# Patient Record
Sex: Male | Born: 1995 | Race: Black or African American | Hispanic: No | Marital: Single | Smoking: Current every day smoker
Health system: Southern US, Community
[De-identification: ages and names within clinical notes are randomized; demographics above are authoritative.]

## PROBLEM LIST (undated history)

## (undated) ENCOUNTER — Emergency Department (HOSPITAL_COMMUNITY): Admission: EM | Payer: Self-pay | Source: Home / Self Care

## (undated) DIAGNOSIS — R569 Unspecified convulsions: Secondary | ICD-10-CM

## (undated) HISTORY — PX: CIRCUMCISION: SHX1350

---
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
## 2018-07-15 ENCOUNTER — Encounter (HOSPITAL_COMMUNITY): Payer: Self-pay

## 2018-07-15 ENCOUNTER — Other Ambulatory Visit: Payer: Self-pay

## 2018-07-15 ENCOUNTER — Ambulatory Visit (HOSPITAL_COMMUNITY)
Admission: EM | Admit: 2018-07-15 | Discharge: 2018-07-15 | Disposition: A | Discharge status: Home / Self Care | Payer: 59 | Attending: Family Medicine | Admitting: Family Medicine

## 2018-07-15 ENCOUNTER — Ambulatory Visit (INDEPENDENT_AMBULATORY_CARE_PROVIDER_SITE_OTHER): Payer: 59

## 2018-07-15 DIAGNOSIS — S0181XA Laceration without foreign body of other part of head, initial encounter: Secondary | ICD-10-CM

## 2018-07-15 DIAGNOSIS — M25511 Pain in right shoulder: Secondary | ICD-10-CM | POA: Diagnosis not present

## 2018-07-15 DIAGNOSIS — S43101A Unspecified dislocation of right acromioclavicular joint, initial encounter: Secondary | ICD-10-CM

## 2018-07-15 DIAGNOSIS — S0990XA Unspecified injury of head, initial encounter: Secondary | ICD-10-CM | POA: Diagnosis not present

## 2018-07-15 DIAGNOSIS — S4991XA Unspecified injury of right shoulder and upper arm, initial encounter: Secondary | ICD-10-CM | POA: Diagnosis not present

## 2018-07-15 DIAGNOSIS — W19XXXA Unspecified fall, initial encounter: Secondary | ICD-10-CM | POA: Diagnosis not present

## 2018-07-15 NOTE — Discharge Instructions (Addendum)
You may use over the counter ibuprofen or acetaminophen as needed for pain.  Please seek prompt medical care if: You have: A very bad (severe) headache that is not helped by medicine. Trouble walking or weakness in your arms and legs. Clear or bloody fluid coming from your nose or ears. Changes in your seeing (vision). You throw up (vomit). Your symptoms get worse. You lose balance. Your speech is slurred. You pass out. You are much sleepier and have trouble staying awake. The black centers of your eyes (pupils) change in size.  These symptoms may be an emergency. Do not wait to see if the symptoms will go away. Get medical help right away. Call your local emergency services. Do not drive yourself to the hospital.

## 2018-07-15 NOTE — ED Triage Notes (Signed)
Patient presents to Urgent Care with complaints of having a seizure and falling straight onto his head since this afternoon. Patient states he thinks his seizure lasted less than 5 minutes but does not know for sure. Oral trauma noted, bleeding to forehead but controlled (patient covered in blood), pain to right shoulder as well, not able to lift arm. Pt states he used to be on seizure medication several years ago but he is not on it anymore. Had seizure last month as well.

## 2018-07-16 NOTE — ED Provider Notes (Signed)
[facility]   [id number] 07/15/18 Arrival Time: 1538  ASSESSMENT & PLAN:  1. Minor head injury, initial encounter   2. Pain in joint of right shoulder   3. Separation of right acromioclavicular joint, initial encounter   4. Forehead laceration, initial encounter    Discussed post-concussive symptoms and recommended follow up if he experiences. Normal neurologic exam. No indication for neurodiagnostic imaging at this time.  I have personally viewed the imaging studies ordered this visit. Question mild AC joint separation.  Orders Placed This Encounter  Procedures  . Apply dressing  . Apply sling arm foam   OTC Tylenol as needed.  Procedure: Verbal consent obtained. Patient provided with risks and alternatives to the procedure. Wound copiously irrigated with NS then cleansed with betadine. Anesthetized with 3 mL of lidocaine without epinephrine. Wound carefully explored. No foreign bodies or nonviable tissue noted. Using sterile technique 5 interrupted 6-0 Prolene sutures were placed to reapproximate his forehead wound. Patient tolerated procedure well. No complications. Minimal bleeding. Patient advised to look for and return for any signs of infection such as redness, swelling, discharge, or worsening pain. Return for suture removal in 5-7 days.   Discharge Instructions     You may use over the counter ibuprofen or acetaminophen as needed for pain.  Please seek prompt medical care if:  You have: ? A very bad (severe) headache that is not helped by medicine. ? Trouble walking or weakness in your arms and legs. ? Clear or bloody fluid coming from your nose or ears. ? Changes in your seeing (vision).  You throw up (vomit).  Your symptoms get worse.  You lose balance.  Your speech is slurred.  You pass out.  You are much sleepier and have trouble staying awake.  The black centers of your eyes (pupils) change in size.  These symptoms may be an emergency.  Do not wait to see if the symptoms will go away. Get medical help right away. Call your local emergency services. Do not drive yourself to the hospital.     See AVS for head injury discharge precautions. ED if needed.  Reviewed expectations re: course of current medical issues. Questions answered. Outlined signs and symptoms indicating need for more acute intervention. Patient verbalized understanding. After Visit Summary given.  SUBJECTIVE: History from: patient. Austin Montoya is a 23 y.o. male who presents with complaint of a head injury earlier today. Seizure disorder with a few breakthrough seizures per month. Reports taking his medications as directed. Does not remember seizure but his girlfriend was with him and witnessed. Reports being told he fell on a tile floor at his residence hitting his forehead with resulting forehead laceration. Minimal post-ictal symptoms. No loss of bowel or bladder control. EMS called to evaluate; forehead wound bandaged. He reports no headache or visual changes. Ambulatory since injury. No extremity sensation changes or weakness. No associated n/v. No visual changes. No recent illnesses or new medications. Reports no h/o recurrent concussions.  Also reports R shoulder pain since fall. "Usually fall on my right side when I have a seizure." Soreness with RUE motion. No swelling. No extremity sensation changes or weakness.  ROS: As per HPI. All other systems negative.  OBJECTIVE:  Vitals:   07/15/18 1559  BP: 122/78  Pulse: (!) 55  Resp: 17  Temp: 98.7 F (37.1 C)  TempSrc: Oral  SpO2: 100%    Glascow Coma Scale: 15  General appearance: alert; no distress HEENT: normocephalic; conjunctivae normal; TMs  normal; oral mucosa normal Neck: supple with FROM; no midline cervical tenderness or deformity; no anterior mass or crepitus; trachea midline Lungs: unlabored respirations Abdomen: soft, non-tender; no bruising Back: no midline tenderness  Extremities: moves all extremities normally but reports pain with R shoulder movement and poorly localized tenderness to palpation; no gross deformities; no edema; symmetrical with no gross deformities Skin: warm and dry; mid forehead with irregular stellate laceration approx 1.5 cm total; moderate bleeding; minimal swelling; minimal tenderness to palpation Neurologic: normal gait Psychological: alert and cooperative; normal mood and affect  Imaging: Dg Shoulder Right  Result Date: 07/15/2018 CLINICAL DATA:  Right shoulder pain after fall. EXAM: RIGHT SHOULDER - 2+ VIEW COMPARISON:  None. FINDINGS: Irregularity and slight flattening of the posterosuperior humeral head and inferior glenoid. Slight elevation of the distal clavicle. Possible coracoid fracture, only seen on one view. No dislocation. Joint spaces are preserved. Bone mineralization is normal. Soft tissues are unremarkable. IMPRESSION: 1. Sequelae of recent shoulder dislocation with humeral head Hill-Sachs deformity and suspected bony Bankart injury. 2. Grade 2 acromioclavicular separation. 3. Possible coracoid fracture, only seen on one view. Electronically Signed   By: Obie Dredge M.D.   On: 07/15/2018 16:53   No Known Allergies   Past Medical History:  Diagnosis Date  . Seizures (HCC)    Past Surgical History:  Procedure Laterality Date  . CIRCUMCISION     Family History  Problem Relation Age of Onset  . Migraines Mother   . ADD / ADHD Father   . Depression Father   . Anxiety disorder Father   . Migraines Brother   . ADD / ADHD Brother   . Leukemia Maternal Grandfather   . Heart Problems Maternal Grandfather   . Migraines Maternal Grandfather   . Migraines Maternal Aunt   . Migraines Cousin        Maternal 1st Cousin   Social History   Socioeconomic History  . Marital status: Single    Spouse name: Not on file  . Number of children: Not on file  . Years of education: Not on file  . Highest education level:  Not on file  Occupational History  . Not on file  Social Needs  . Financial resource strain: Not on file  . Food insecurity:    Worry: Not on file    Inability: Not on file  . Transportation needs:    Medical: Not on file    Non-medical: Not on file  Tobacco Use  . Smoking status: Current Every Day Smoker    Packs/day: 1.00    Types: Cigarettes  . Smokeless tobacco: Never Used  Substance and Sexual Activity  . Alcohol use: No    Comment: Drinks alcohol twice a month  . Drug use: No    Frequency: 7.0 times per week    Types: Marijuana  . Sexual activity: Yes  Lifestyle  . Physical activity:    Days per week: Not on file    Minutes per session: Not on file  . Stress: Not on file  Relationships  . Social connections:    Talks on phone: Not on file    Gets together: Not on file    Attends religious service: Not on file    Active member of club or organization: Not on file    Attends meetings of clubs or organizations: Not on file    Relationship status: Not on file  Other Topics Concern  . Not on file  Social History Narrative  Alben graduated from high school. He is not currently employed.   He enjoys playing basketball.    Lives with his parents.        Mardella Layman, MD 07/16/18 [phone]

## 2019-10-03 ENCOUNTER — Emergency Department (HOSPITAL_COMMUNITY): Payer: No Typology Code available for payment source

## 2019-10-03 ENCOUNTER — Encounter (HOSPITAL_COMMUNITY): Payer: Self-pay | Admitting: Emergency Medicine

## 2019-10-03 ENCOUNTER — Ambulatory Visit (HOSPITAL_COMMUNITY)
Admission: EM | Admit: 2019-10-03 | Discharge: 2019-10-03 | Disposition: A | Discharge status: Home / Self Care | Payer: No Typology Code available for payment source | Attending: Nurse Practitioner | Admitting: Nurse Practitioner

## 2019-10-03 ENCOUNTER — Other Ambulatory Visit: Payer: Self-pay

## 2019-10-03 ENCOUNTER — Emergency Department (HOSPITAL_COMMUNITY)
Admission: EM | Admit: 2019-10-03 | Discharge: 2019-10-04 | Disposition: A | Discharge status: Home / Self Care | Payer: No Typology Code available for payment source | Attending: Emergency Medicine | Admitting: Emergency Medicine

## 2019-10-03 DIAGNOSIS — Z79899 Other long term (current) drug therapy: Secondary | ICD-10-CM | POA: Insufficient documentation

## 2019-10-03 DIAGNOSIS — F322 Major depressive disorder, single episode, severe without psychotic features: Secondary | ICD-10-CM | POA: Diagnosis not present

## 2019-10-03 DIAGNOSIS — G40909 Epilepsy, unspecified, not intractable, without status epilepticus: Secondary | ICD-10-CM | POA: Insufficient documentation

## 2019-10-03 DIAGNOSIS — F17290 Nicotine dependence, other tobacco product, uncomplicated: Secondary | ICD-10-CM | POA: Insufficient documentation

## 2019-10-03 DIAGNOSIS — R45851 Suicidal ideations: Secondary | ICD-10-CM | POA: Insufficient documentation

## 2019-10-03 DIAGNOSIS — F419 Anxiety disorder, unspecified: Secondary | ICD-10-CM | POA: Insufficient documentation

## 2019-10-03 DIAGNOSIS — F329 Major depressive disorder, single episode, unspecified: Secondary | ICD-10-CM | POA: Insufficient documentation

## 2019-10-03 DIAGNOSIS — F17210 Nicotine dependence, cigarettes, uncomplicated: Secondary | ICD-10-CM | POA: Insufficient documentation

## 2019-10-03 DIAGNOSIS — M25511 Pain in right shoulder: Secondary | ICD-10-CM | POA: Insufficient documentation

## 2019-10-03 DIAGNOSIS — R45850 Homicidal ideations: Secondary | ICD-10-CM | POA: Insufficient documentation

## 2019-10-03 DIAGNOSIS — R569 Unspecified convulsions: Secondary | ICD-10-CM

## 2019-10-03 LAB — CBC
HCT: 41.3 % (ref 39.0–52.0)
Hemoglobin: 13.9 g/dL (ref 13.0–17.0)
MCH: 29.1 pg (ref 26.0–34.0)
MCHC: 33.7 g/dL (ref 30.0–36.0)
MCV: 86.6 fL (ref 80.0–100.0)
Platelets: 198 10*3/uL (ref 150–400)
RBC: 4.77 MIL/uL (ref 4.22–5.81)
RDW: 12.1 % (ref 11.5–15.5)
WBC: 6.8 10*3/uL (ref 4.0–10.5)
nRBC: 0 % (ref 0.0–0.2)

## 2019-10-03 LAB — COMPREHENSIVE METABOLIC PANEL
ALT: 21 U/L (ref 0–44)
AST: 36 U/L (ref 15–41)
Albumin: 4.5 g/dL (ref 3.5–5.0)
Alkaline Phosphatase: 61 U/L (ref 38–126)
Anion gap: 8 (ref 5–15)
BUN: 12 mg/dL (ref 6–20)
CO2: 24 mmol/L (ref 22–32)
Calcium: 9.3 mg/dL (ref 8.9–10.3)
Chloride: 108 mmol/L (ref 98–111)
Creatinine, Ser: 0.75 mg/dL (ref 0.61–1.24)
GFR calc Af Amer: >60 mL/min (ref >60)
GFR calc non Af Amer: >60 mL/min (ref >60)
Glucose, Bld: 87 mg/dL (ref 70–99)
Potassium: 3.6 mmol/L (ref 3.5–5.1)
Sodium: 140 mmol/L (ref 135–145)
Total Bilirubin: 0.1 mg/dL — ABNORMAL LOW (ref 0.3–1.2)
Total Protein: 7.3 g/dL (ref 6.5–8.1)

## 2019-10-03 LAB — SALICYLATE LEVEL: Salicylate Lvl: <7 mg/dL — ABNORMAL LOW (ref 7.0–30.0)

## 2019-10-03 LAB — ACETAMINOPHEN LEVEL: Acetaminophen (Tylenol), Serum: <10 ug/mL — ABNORMAL LOW (ref 10–30)

## 2019-10-03 LAB — ETHANOL: Alcohol, Ethyl (B): <10 mg/dL (ref <10)

## 2019-10-03 LAB — VALPROIC ACID LEVEL: Valproic Acid Lvl: <10 ug/mL — ABNORMAL LOW (ref 50.0–100.0)

## 2019-10-03 MED ORDER — TOPIRAMATE 100 MG PO TABS
200.0000 mg | ORAL_TABLET | Freq: Two times a day (BID) | ORAL | Status: DC
  Administered 2019-10-03 – 2019-10-04 (×2): 200 mg via ORAL
  Filled 2019-10-03 (×2): qty 2
  Filled 2019-10-03: qty 8
  Filled 2019-10-03: qty 2

## 2019-10-03 MED ORDER — LEVETIRACETAM IN NACL 1000 MG/100ML IV SOLN
1000.0000 mg | Freq: Once | INTRAVENOUS | Status: AC
  Administered 2019-10-03: 1000 mg via INTRAVENOUS
  Filled 2019-10-03: qty 100

## 2019-10-03 MED ORDER — SODIUM CHLORIDE 0.9 % IV BOLUS
1000.0000 mL | Freq: Once | INTRAVENOUS | Status: AC
  Administered 2019-10-03: 1000 mL via INTRAVENOUS

## 2019-10-03 NOTE — BH Assessment (Signed)
Comprehensive Clinical Assessment (CCA) Note  10/03/2019 Austin Montoya [id number] Patient said that he called 911 because he was depressed and overwhelmed.  He said the [employer] did come to the home to bring him to [facility].  Patient has been having suicidal ideations with no plan.  He wrote a note to his family and children explaining that he could not go on.  Patient denies any previous attempts.  Patient has been feeling this way greater than 3 months. Pt says "I don't see the point of going on."  He also says "I don't want to be here anymore."  Permission was given to contact mother, with whom he lives.  She said that he has said the same things today.    Patient had no HI or A/V hallucinations.  Patient denies use of ETOH or illicit drugs.  Pt has flat affect.  He is cooperative and has good eyesight.  Patient is oriented x5.  He is not responding to internal stimuli.  He is not engaged in delusional thought process.  Patient thought process is linear and coherent.  Patient is not sleeping well and has poor appetite.    Patient stressors are that he has friction between him and "baby mamma" of his three children.  Patient also has a seizure d/o that interferes with his getting and keeping a job.  Patient sees no way out of his cycle.  Patient has no outpatient therapy.  When asked if he was interested in getting an appointment with a therapist he said he was not.  Patient has no previous inpatient hospitalizations.  Austin Conn, FNP saw patient and completed MSE.  Patient was recommended for continuous assessment until psychiatrist could see him.  Patient does not wish to stay and wants to go home.  Patient did sign a AMA form and left [facility]  Visit Diagnosis:      ICD-10-CM   1. Severe major depression, single episode, without psychotic features (HCC)  F32.2     PHQ9 SCORE ONLY 10/03/2019  PHQ-9 Total Score 14      CCA Screening, Triage and Referral (STR)  Patient Reported Information How did  you hear about [place]? Other (Comment) ([employer] brought patient to [facility])  Referral name: Pt called 911 for assistance and [employer] brought patient over.  Referral phone number: No data recorded  Whom do you see for routine medical problems? Primary Care  Practice/Facility Name: Dr. Johnsie Kindred at [facility]  Practice/Facility Phone Number: No data recorded Name of Contact: No data recorded Contact Number: No data recorded Contact Fax Number: No data recorded Prescriber Name: No data recorded Prescriber Address (if known): No data recorded  What Is the Reason for Your Visit/Call Today? Depression and suicidal thoughts.  How Long Has This Been Causing You Problems? 1-6 months  What Do You Feel Would Help You the Most Today? Assessment Only   Have You Recently Been in Any Inpatient Treatment (Hospital/Detox/Crisis Center/28-Day Program)? No  Name/Location of Program/Hospital:No data recorded How Long Were You There? No data recorded When Were You Discharged? No data recorded  Have You Ever Received Services From [facility] Before? Yes  Who Do You See at [facility]? ED visit in 07/2018   Have You Recently Had Any Thoughts About Hurting Yourself? Yes  Are You Planning to Commit Suicide/Harm Yourself At This time? No (Feels he is a burden to others.)   Have you Recently Had Thoughts About Hurting Someone Austin Montoya? No  Explanation: No data recorded  Have  You Used Any Alcohol or Drugs in the Past 24 Hours? No  How Long Ago Did You Use Drugs or Alcohol? No data recorded What Did You Use and How Much? No data recorded  Do You Currently Have a Therapist/Psychiatrist? No  Name of Therapist/Psychiatrist: No data recorded  Have You Been Recently Discharged From Any Office Practice or Programs? No  Explanation of Discharge From Practice/Program: No data recorded    CCA Screening Triage Referral Assessment Type of Contact: Face-to-Face  Is this Initial or Reassessment? No data  recorded Date Telepsych consult ordered in [facility]:  No data recorded Time Telepsych consult ordered in [facility]:  No data recorded  Patient Reported Information Reviewed? Yes  Patient Left Without Being Seen? No data recorded Reason for Not Completing Assessment: No data recorded  Collateral Involvement: Austin Montoya (mother) was contacted   Does Patient Have a Stage manager Guardian? No data recorded Name and Contact of Legal Guardian: No data recorded If Minor and Not Living with Parent(s), Who has Custody? No data recorded Is CPS involved or ever been involved? No data recorded Is APS involved or ever been involved? No data recorded  Patient Determined To Be At Risk for Harm To Self or Others Based on Review of Patient Reported Information or Presenting Complaint? No data recorded Method: No data recorded Availability of Means: No data recorded Intent: No data recorded Notification Required: No data recorded Additional Information for Danger to Others Potential: No data recorded Additional Comments for Danger to Others Potential: No data recorded Are There Guns or Other Weapons in Your Home? No data recorded Types of Guns/Weapons: No data recorded Are These Weapons Safely Secured?                            No data recorded Who Could Verify You Are Able To Have These Secured: No data recorded Do You Have any Outstanding Charges, Pending Court Dates, Parole/Probation? No data recorded Contacted To Inform of Risk of Harm To Self or Others: No data recorded  Location of Assessment: [facility] Assessment Services   Does Patient Present under Involuntary Commitment? No  IVC Papers Initial File Date: No data recorded  [place] of Residence: [place]   Patient Currently Receiving the Following Services: Not Receiving Services   Determination of Need: Emergent (2 hours)   Options For Referral: Therapeutic Triage Services     CCA Biopsychosocial  Intake/Chief Complaint:   CCA Intake With Chief Complaint CCA Part Two Date: 10/03/19 CCA Part Two Time: 0613 Patient's Currently Reported Symptoms/Problems: Depression, thoughts of suicide Type of Services Patient Feels Are Needed: Pt is not sure Initial Clinical Notes/Concerns: Depression, passing SI  Mental Health Symptoms Depression:  Depression: Change in energy/activity, Difficulty Concentrating, Fatigue, Hopelessness, Increase/decrease in appetite, Sleep (too much or little), Worthlessness, Duration of symptoms less than two weeks  Mania:  Mania: None  Anxiety:   Anxiety: Fatigue, Worrying  Psychosis:  Psychosis: None  Trauma:  Trauma: None  Obsessions:  Obsessions: None  Compulsions:  Compulsions: None  Inattention:  Inattention: Forgetful, Loses things, Symptoms present in 2 or more settings  Hyperactivity/Impulsivity:  Hyperactivity/Impulsivity: N/A  Oppositional/Defiant Behaviors:  Oppositional/Defiant Behaviors: None  Emotional Irregularity:  Emotional Irregularity: None  Other Mood/Personality Symptoms:      Mental Status Exam Appearance and self-care  Stature:  Stature: Average  Weight:     Clothing:  Clothing: Casual  Grooming:  Grooming: Well-groomed  Cosmetic use:  Cosmetic Use: None  Posture/gait:  Posture/Gait: Normal  Motor activity:  Motor Activity: Not Remarkable  Sensorium  Attention:  Attention: Normal  Concentration:  Concentration: Variable  Orientation:  Orientation: X5  Recall/memory:  Recall/Memory: Defective in Short-term, Defective in Immediate  Affect and Mood  Affect:  Affect: Blunted, Depressed, Flat  Mood:  Mood: Depressed  Relating  Eye contact:  Eye Contact: Normal  Facial expression:  Facial Expression: Sad  Attitude toward examiner:  Attitude Toward Examiner: Cooperative  Thought and Language  Speech flow: Speech Flow: Clear and Coherent  Thought content:  Thought Content: Appropriate to Mood and Circumstances  Preoccupation:  Preoccupations: None   Hallucinations:  Hallucinations: None  Organization:     Company secretary of Knowledge:  Fund of Knowledge: Average  Intelligence:  Intelligence: Average  Abstraction:  Abstraction: Normal  Judgement:     Reality Testing:  Reality Testing: Realistic  Insight:  Insight: Good  Decision Making:  Decision Making: Normal  Social Functioning  Social Maturity:  Social Maturity: Isolates  Social Judgement:  Social Judgement: Normal  Stress  Stressors:  Stressors: Surveyor, quantity, Relationship, Other (Comment) (Health issues (seizure d/o))  Coping Ability:     Skill Deficits:  Skill Deficits: Decision making  Supports:  Supports: Support needed     Religion:    Leisure/Recreation:    Exercise/Diet:     CCA Employment/Education  Employment/Work Situation: Employment / Work Psychologist, occupational Employment situation: (P) Unemployed Patient's job has been impacted by current illness: (P) Yes Describe how patient's job has been impacted: (P) Pt is unemployed due to seizures. What is the longest time patient has a held a job?: (P) About a year.  Has seizure and cannot hold a job. Has patient ever been in the [employer]?: (P) No  Education:     CCA Family/Childhood History  Family and Relationship History:    Childhood History:     Child/Adolescent Assessment:     CCA Substance Use  Alcohol/Drug Use:                           ASAM's:  Six Dimensions of Multidimensional Assessment  Dimension 1:  Acute Intoxication and/or Withdrawal Potential:      Dimension 2:  Biomedical Conditions and Complications:      Dimension 3:  Emotional, Behavioral, or Cognitive Conditions and Complications:     Dimension 4:  Readiness to Change:     Dimension 5:  Relapse, Continued use, or Continued Problem Potential:     Dimension 6:  Recovery/Living Environment:     ASAM Severity Score:    ASAM Recommended Level of Treatment:     Substance use Disorder (SUD)     Recommendations for Services/Supports/Treatments:    DSM5 Diagnoses: Patient Active Problem List   Diagnosis Date Noted  . Seizure disorder (HCC) 12/24/2012  . 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

## 2019-10-03 NOTE — BH Assessment (Signed)
Nira Conn, FNP did complete the affidavit and petition form for patient.  It will be faxed to the magistrate.

## 2019-10-03 NOTE — ED Notes (Signed)
Called made to the tts number  Was told shift change there and someone would see about getting this pt ttsd

## 2019-10-03 NOTE — ED Notes (Signed)
Nurse Discharge Note:  D:Patient denies SI/HI/AVH at this time. Pt appears calm and cooperative, and no distress noted.  A: All Personal items in locker returned to pt. Pt given AVS reviewed with him provider and he verbalized understanding. Patient signed AMA form.  R:  Pt escorted to lobby and discharged home.

## 2019-10-03 NOTE — ED Notes (Signed)
Pt still waiting for tts

## 2019-10-03 NOTE — ED Notes (Signed)
Pt transported to Xray. 

## 2019-10-03 NOTE — ED Triage Notes (Signed)
Pt to triage via [employer] with IVC papers.  Pt states he is suicidal x 6 months without a specific plan. Pt cooperative at this time.

## 2019-10-03 NOTE — Progress Notes (Signed)
Patient meets criteria for inpatient treatment per Nira Conn, NP. No appropriate beds at [facility] currently. CSW faxed referrals to the following facilities for review:  [facility]   [facility]   [facility]   [facility]   [facility]   [facility]    TTS will continue to seek bed placement.     Ruthann Cancer MSW, [employer] Clincal Social Worker Disposition  [facility] 10/03/2019 3:19 PM

## 2019-10-03 NOTE — ED Provider Notes (Signed)
Behavioral Health Medical Screening Exam  Austin Montoya is a 24 y.o. male who presents to [facility] voluntarily due to depression and suicidal thoughts. Patient states that he is tired of living. States that he does not know why but he has been consistently having recurrent thoughts of death. States "I don't want to do it, but I don't want to live. Denies a suicide plan. Patient provided [facility] staff with a  note that he wrote to his children that has suicidal connotations. Patient states "I don't think I will do anything if go home today, but all I think about is not being here. If that is all I am going to think about I might as well retire myself." He reports homicidal thoughts, denies intent/plan. States "I just get angry sometimes." He denies use of alcohol and illicit substances. Denies marijuana use. States that he smokes black & mild. Denies auditory and visual halluciantions. No indication that he is responding to internal stimuli.  He reports a history of a seizure disorder. Reports that he is prescribed topamax. Denies recent seizures.    Total Time spent with patient: 30 minutes  Psychiatric Specialty Exam  Presentation  General Appearance:Casual;Well Groomed  Eye Contact:Fair  Speech:Clear and Coherent;Normal Rate  Speech Volume:Decreased  Handedness:No data recorded  Mood and Affect  Mood:Anxious;Dysphoric;Hopeless;Worthless  Affect:Blunt   Thought Process  Thought Processes:Coherent;Linear  Descriptions of Associations:Intact  Orientation:Full (Time, Place and Person)  Thought Content:Logical  Hallucinations:None  Ideas of Reference:None  Suicidal Thoughts:Yes, Active  Homicidal Thoughts:Yes, Passive Without Intent;Without Plan   Sensorium  Memory:Immediate Fair;Recent Fair;Remote Fair  Judgment:Intact  Insight:Lacking   Executive Functions  Concentration:Fair  Attention Span:Fair  [place] of  Knowledge:Good  Language:Good   Psychomotor Activity  Psychomotor Activity:Normal   Assets  Assets:Desire for Improvement;Housing;Leisure Time;Physical Health   Sleep  Sleep:Fair  Number of hours: No data recorded  Physical Exam: Physical Exam Constitutional:      General: He is not in acute distress.    Appearance: He is not ill-appearing, toxic-appearing or diaphoretic.  HENT:     Head: Normocephalic.     Right Ear: External ear normal.     Left Ear: External ear normal.  Eyes:     Pupils: Pupils are equal, round, and reactive to light.  Cardiovascular:     Rate and Rhythm: Normal rate.  Pulmonary:     Effort: Pulmonary effort is normal. No respiratory distress.  Musculoskeletal:        General: Normal range of motion.  Neurological:     Mental Status: He is alert and oriented to person, place, and time.  Psychiatric:        Attention and Perception: He does not perceive auditory or visual hallucinations.        Mood and Affect: Affect is blunt.        Behavior: Behavior is cooperative.        Thought Content: Thought content is not paranoid or delusional. Thought content includes homicidal and suicidal ideation. Thought content does not include homicidal plan.    Review of Systems  Constitutional: Negative for chills, diaphoresis, fever, malaise/fatigue and weight loss.  HENT: Negative for congestion.   Respiratory: Negative for cough and shortness of breath.   Cardiovascular: Negative for chest pain.  Gastrointestinal: Negative for diarrhea, nausea and vomiting.  Neurological: Negative for dizziness. Seizures: patient has a history of a seizure disorder. Denies recent seizures.  Psychiatric/Behavioral: Positive for depression and suicidal ideas. Negative for hallucinations, memory loss  and substance abuse. The patient is nervous/anxious and has insomnia.    Blood pressure 121/69, pulse (!) 59, temperature 98.4 F (36.9 C), temperature source Oral, resp. rate  18, SpO2 100 %. There is no height or weight on file to calculate BMI.  Musculoskeletal: Strength & Muscle Tone: within normal limits Gait & Station: normal Patient leans: N/A   Recommendations:  Based on my evaluation the patient does not appear to have an emergency medical condition.   Patient is refusing to stay at [facility]. Oncoming TTS staff will examine patient for IVC.  Jackelyn Poling, NP 10/03/2019, 7:11 AM

## 2019-10-03 NOTE — ED Notes (Signed)
Pt noted to be brady in 40's, and hypotensive 80's SBP while sleeping. When awake, SBP>100 and HR >60's.

## 2019-10-03 NOTE — ED Notes (Signed)
Sleeping since 1530

## 2019-10-03 NOTE — ED Notes (Signed)
No one is answering at the tts number  This pt is still waiting

## 2019-10-03 NOTE — ED Provider Notes (Signed)
[facility] EMERGENCY DEPARTMENT Provider Note   CSN: [id number] Arrival date & time: 10/03/19  7829     History Chief Complaint  Patient presents with  . Suicidal    IVC    Austin Montoya is a 24 y.o. male.  HPI    24 year old male with a history of epilepsy and depression, presents with [employer] under IVC with concern for suicidal ideation, and had a seizure while in triage.  He was evaluated earlier this morning by behavioral health Gery Pray NP, at which time he had presented for depression and suicidal thoughts.  He reported he was tired of living and having recurrent thoughts of death.  He denied a plan at that time.  He provided a note that he wrote to his children that said "I do not think I will do anything if I go home today, but all I think about is not being here.  If that is all I am going to think about I might as well retire myself."  Denied use of alcohol or illicit substances.  Smokes Black and mild.  Denied auditory or visual hallucinations.  He reported at behavioral health that he had a seizure disorder and was prescribed Topamax but denies recent seizures.   At time of my history, my history is limited by his altered mental status and postictal state following seizure.  He does report he has a seizure disorder, but reports at this time he does not remember if he has been taking his medications.  He does not remember why he came to the emergency department at this time.   Past Medical History:  Diagnosis Date  . Seizures [facility])     Patient Active Problem List   Diagnosis Date Noted  . Severe major depression without psychotic features (HCC)   . Seizure disorder (HCC) 12/24/2012  . 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 CIRCUMCISION         Family History  Problem Relation Age of Onset  . Migraines Mother   . ADD / ADHD Father   . Depression Father   . Anxiety disorder  Father   . Migraines Brother   . ADD / ADHD Brother   . Leukemia Maternal Grandfather   . Heart Problems Maternal Grandfather   . Migraines Maternal Grandfather   . Migraines Maternal Aunt   . Migraines Cousin        Maternal 1st Cousin    Social History   Tobacco Use  . Smoking status: Current Every Day Smoker    Packs/day: 1.00    Types: Cigarettes  . Smokeless tobacco: Never Used  Vaping Use  . Vaping Use: Never used  Substance Use Topics  . Alcohol use: No    Comment: Drinks alcohol twice a month  . Drug use: No    Frequency: 7.0 times per week    Types: Marijuana    Home Medications Prior to Admission medications   Medication Sig Start Date End Date Taking? Authorizing Provider  topiramate (TOPAMAX) 100 MG tablet Take 2 tablets by mouth in the morning and take 2 tablets by mouth at night 09/05/15  Yes Elveria Rising, NP    Allergies    Patient has no known allergies.  Review of Systems   Review of Systems  Constitutional: Negative for fever.  HENT: Negative for sore throat.   Eyes: Negative for visual disturbance.  Respiratory: Negative for shortness of breath.  Cardiovascular: Negative for chest pain.  Gastrointestinal: Negative for abdominal pain, nausea and vomiting.  Genitourinary: Negative for difficulty urinating.  Musculoskeletal: Positive for arthralgias (right shoulder since seizure). Negative for back pain and neck stiffness.  Skin: Negative for rash.  Neurological: Positive for seizures. Negative for syncope and headaches.  Psychiatric/Behavioral: Positive for suicidal ideas.    Physical Exam Updated Vital Signs BP 95/61   Pulse (!) 50   Temp 98.7 F (37.1 C) (Oral)   Resp 16   Ht 5\' 7"  (1.702 m)   Wt 52.2 kg   SpO2 100%   BMI 18.01 kg/m   Physical Exam Vitals and nursing note reviewed.  Constitutional:      General: He is not in acute distress.    Appearance: He is well-developed. He is not diaphoretic.  HENT:     Head:  Normocephalic and atraumatic.  Eyes:     Conjunctiva/sclera: Conjunctivae normal.  Cardiovascular:     Rate and Rhythm: Normal rate and regular rhythm.     Heart sounds: Normal heart sounds. No murmur heard.  No friction rub. No gallop.   Pulmonary:     Effort: Pulmonary effort is normal. No respiratory distress.     Breath sounds: Normal breath sounds. No wheezing or rales.  Abdominal:     General: There is no distension.     Palpations: Abdomen is soft.     Tenderness: There is no abdominal tenderness. There is no guarding.  Musculoskeletal:     Cervical back: Normal range of motion.  Skin:    General: Skin is warm and dry.  Neurological:     Mental Status: He is alert and oriented to person, place, and time.     GCS: GCS eye subscore is 4. GCS verbal subscore is 5. GCS motor subscore is 6.     Cranial Nerves: Cranial nerves are intact.     Sensory: Sensation is intact. No sensory deficit.     Motor: Motor function is intact. No weakness.     ED Results / Procedures / Treatments   Labs (all labs ordered are listed, but only abnormal results are displayed) Labs Reviewed  COMPREHENSIVE METABOLIC PANEL - Abnormal; Notable for the following components:      Result Value   Total Bilirubin 0.1 (*)    All other components within normal limits  SALICYLATE LEVEL - Abnormal; Notable for the following components:   Salicylate Lvl <2.9 (*)    All other components within normal limits  ACETAMINOPHEN LEVEL - Abnormal; Notable for the following components:   Acetaminophen (Tylenol), Serum <10 (*)    All other components within normal limits  VALPROIC ACID LEVEL - Abnormal; Notable for the following components:   Valproic Acid Lvl <10 (*)    All other components within normal limits  ETHANOL  CBC  RAPID URINE DRUG SCREEN, HOSP PERFORMED  TOPIRAMATE LEVEL    EKG EKG Interpretation  Date/Time:  Sunday October 03 2019 10:44:00 EDT Ventricular Rate:  109 PR Interval:    QRS  Duration: 85 QT Interval:  335 QTC Calculation: 452 R Axis:   80 Text Interpretation: Sinus tachycardia Probable left atrial enlargement Baseline wander in lead(s) V2 No previous ECGs available Confirmed by Gareth Morgan [phone]) on 10/03/2019 11:41:25 AM   Radiology DG Shoulder Right  Result Date: 10/03/2019 CLINICAL DATA:  Seizure.  Pain. EXAM: RIGHT SHOULDER - 2+ VIEW COMPARISON:  None. FINDINGS: There is no evidence of fracture or dislocation. There is no evidence  of arthropathy or other focal bone abnormality. Soft tissues are unremarkable. IMPRESSION: Negative. Electronically Signed   By: Gerome Sam III M.D   On: 10/03/2019 14:50    Procedures Procedures (including critical care time)  Medications Ordered in ED Medications  topiramate (TOPAMAX) tablet 200 mg (200 mg Oral Given 10/03/19 1500)  levETIRAcetam (KEPPRA) IVPB 1000 mg/100 mL premix (0 mg Intravenous Stopped 10/03/19 1252)  sodium chloride 0.9 % bolus 1,000 mL (0 mLs Intravenous Stopped 10/03/19 1435)    ED Course  I have reviewed the triage vital signs and the nursing notes.  Pertinent labs & imaging results that were available during my care of the patient were reviewed by me and considered in my medical decision making (see chart for details).    MDM Rules/Calculators/A&P                          24 year old male with a history of epilepsy and depression, presents with [employer] under IVC with concern for suicidal ideation placed by behavior health staff and had a seizure while in triage.  Regarding seizure, he had 1 minute episode that resolved prior to medications.  Labs show no significant findigns.  Depakote level negative however on further review he is no longer prescribed this medication for seizures.  Given keppra 1g.  No further seizures. Had not had seizure in 1-2 months.  Suspect seizure likely secondary to sleep deprivation and patient missing his morning dose of Topamax.  Given he has been overall  stable on his dose of Topamax, has not tolerated other seizure medications, will recommend continuing this dose of medication and following up with his neurologist as an outpatient.  Noted to have decreased blood pressure and heart rate while sleeping. He is 52kg and suspect this is his baseline with sleep as he is noted to have normal vital signs when he is awake.  Do not suspect infectious or other ingestion as etiology of these symptoms.  XR shoulder for pain after seizure without acute abnormalities.  Completed first exam to continue IVC. TTS consulted.  He is medically cleared and to continue his seizure medications and see Neurology as an outpatient.    Final Clinical Impression(s) / ED Diagnoses Final diagnoses:  Suicidal ideation  Seizure [facility])    Rx / DC Orders ED Discharge Orders    None       Alvira Monday, MD 10/03/19 [phone]

## 2019-10-03 NOTE — ED Notes (Signed)
The pt is still waiting for tts  He has made several phone calls unhappy that he has been placed here

## 2019-10-03 NOTE — ED Notes (Signed)
Pt was changed into burgundy scrubs, wanded by security, and [employer] left.  Pt was sitting in recliner at triage and had a seizure lasting approx 1 min.  Pt taken straight to treatment room.

## 2019-10-03 NOTE — ED Notes (Signed)
The pt has calmed down somewhat  Still waiting for tts

## 2019-10-03 NOTE — ED Notes (Signed)
The pt is getting more agitated pacing  He report that he has 3 children at home and this is fathers day and he needs to leave

## 2019-10-03 NOTE — ED Notes (Signed)
Patient transported to X-ray 

## 2019-10-03 NOTE — ED Notes (Signed)
Pt getting agitated  Wanting to leave   He did not know that he could not walk out  I explained that he was ivcd  He wants to leave,  Security called to stand by

## 2019-10-04 LAB — RAPID URINE DRUG SCREEN, HOSP PERFORMED
Amphetamines: NOT DETECTED
Barbiturates: NOT DETECTED
Benzodiazepines: NOT DETECTED
Cocaine: NOT DETECTED
Opiates: NOT DETECTED
Tetrahydrocannabinol: POSITIVE — AB

## 2019-10-04 NOTE — ED Notes (Signed)
Lunch Tray Ordered @ 1050. 

## 2019-10-04 NOTE — ED Provider Notes (Signed)
  Physical Exam  BP 110/71 (BP Location: Right Arm)   Pulse (!) 52   Temp 97.8 F (36.6 C) (Oral)   Resp 17   Ht 5\' 7"  (1.702 m)   Wt 52.2 kg   SpO2 99%   BMI 18.01 kg/m   Physical Exam  ED Course/Procedures     Procedures  MDM  Patient's been seen by psychiatry and cleared for discharge.  Will reverse IVC.       , MD 10/04/19 1555

## 2019-10-04 NOTE — ED Notes (Signed)
Pt eating a sandwich urine collected  Med given

## 2019-10-04 NOTE — BH Assessment (Addendum)
[facility] Assessment Progress Note This Probation officer met with patient this date to evaluate current mental health status. Patient contracts for safety and denies any S/I, H/I or AVH. Patient states he was recently involved in verbal altercation with his partner that resulted in patient making passive S/I statements. Patient reports he would never harm himself or anyone else. Patient reports when questioned in reference to the statements he made of "not wanting to be here" patient stated he was meaning he didn't want to be in that location not that he wanted to die. This Probation officer spoke with patient's mother [name] [phone] with patient's permission, who stated she felt if patient was discharged he would not self harm. Patient currently resides with his mother. Case was staffed with Dwyane Dee MD who recommended IVC be rescinded and patient be discharged this date. Patient's nurse and EDP to be notified. 42 EDP notified of disposition and will rescind IVC.

## 2019-10-04 NOTE — ED Notes (Signed)
Ford called from [facility]  He reports that  The pt will be assessed in the am

## 2019-10-04 NOTE — ED Notes (Signed)
All belongings returned to patient (clothing & cellphone) Called for a ride. Ambulatory

## 2019-10-04 NOTE — ED Notes (Signed)
The old [facility] number has answered and we were told that  The pt had been assessed early this am  And will be reassessed tomorrow message given to the pt  hes not very happy but seemed to settle down and relax

## 2019-10-04 NOTE — ED Notes (Signed)
Pt given phone call.  

## 2019-10-04 NOTE — Discharge Instructions (Signed)
Follow-up with your doctors as needed. 

## 2019-10-05 LAB — TOPIRAMATE LEVEL: Topiramate Lvl: <1 ug/mL — ABNORMAL LOW (ref 2.0–25.0)

## 2019-10-29 ENCOUNTER — Ambulatory Visit (HOSPITAL_COMMUNITY)
Admission: EM | Admit: 2019-10-29 | Discharge: 2019-10-29 | Discharge status: Against Medical Advice | Payer: No Typology Code available for payment source

## 2020-11-29 ENCOUNTER — Encounter (HOSPITAL_COMMUNITY): Payer: Self-pay

## 2020-11-29 ENCOUNTER — Emergency Department (HOSPITAL_COMMUNITY)
Admission: EM | Admit: 2020-11-29 | Discharge: 2020-11-29 | Disposition: A | Discharge status: Home / Self Care | Payer: BC Managed Care – PPO | Attending: Emergency Medicine | Admitting: Emergency Medicine

## 2020-11-29 ENCOUNTER — Other Ambulatory Visit: Payer: Self-pay

## 2020-11-29 DIAGNOSIS — R569 Unspecified convulsions: Secondary | ICD-10-CM | POA: Insufficient documentation

## 2020-11-29 DIAGNOSIS — M25511 Pain in right shoulder: Secondary | ICD-10-CM | POA: Diagnosis not present

## 2020-11-29 DIAGNOSIS — Z5321 Procedure and treatment not carried out due to patient leaving prior to being seen by health care provider: Secondary | ICD-10-CM | POA: Diagnosis not present

## 2020-11-29 DIAGNOSIS — R519 Headache, unspecified: Secondary | ICD-10-CM | POA: Insufficient documentation

## 2020-11-29 LAB — CBC WITH DIFFERENTIAL/PLATELET
Abs Immature Granulocytes: 0.04 10*3/uL (ref 0.00–0.07)
Basophils Absolute: 0 10*3/uL (ref 0.0–0.1)
Basophils Relative: 0 %
Eosinophils Absolute: 0 10*3/uL (ref 0.0–0.5)
Eosinophils Relative: 0 %
HCT: 42.3 % (ref 39.0–52.0)
Hemoglobin: 14 g/dL (ref 13.0–17.0)
Immature Granulocytes: 0 %
Lymphocytes Relative: 15 %
Lymphs Abs: 1.4 10*3/uL (ref 0.7–4.0)
MCH: 28.6 pg (ref 26.0–34.0)
MCHC: 33.1 g/dL (ref 30.0–36.0)
MCV: 86.5 fL (ref 80.0–100.0)
Monocytes Absolute: 0.5 10*3/uL (ref 0.1–1.0)
Monocytes Relative: 5 %
Neutro Abs: 7.7 10*3/uL (ref 1.7–7.7)
Neutrophils Relative %: 80 %
Platelets: 227 10*3/uL (ref 150–400)
RBC: 4.89 MIL/uL (ref 4.22–5.81)
RDW: 13 % (ref 11.5–15.5)
WBC: 9.8 10*3/uL (ref 4.0–10.5)
nRBC: 0 % (ref 0.0–0.2)

## 2020-11-29 LAB — BASIC METABOLIC PANEL
Anion gap: 6 (ref 5–15)
BUN: 10 mg/dL (ref 6–20)
CO2: 24 mmol/L (ref 22–32)
Calcium: 9.3 mg/dL (ref 8.9–10.3)
Chloride: 107 mmol/L (ref 98–111)
Creatinine, Ser: 0.87 mg/dL (ref 0.61–1.24)
GFR, Estimated: >60 mL/min (ref >60)
Glucose, Bld: 88 mg/dL (ref 70–99)
Potassium: 3.9 mmol/L (ref 3.5–5.1)
Sodium: 137 mmol/L (ref 135–145)

## 2020-11-29 NOTE — ED Notes (Signed)
Called pt again for vitals, no response.  

## 2020-11-29 NOTE — ED Notes (Signed)
Call pt for vitals, no response.

## 2020-11-29 NOTE — ED Triage Notes (Signed)
Pt reports he ran out of his topamax about 1 week ago and he had a seizure today. Pt has a knot on the left side of his forehead and pain to his right shoulder. Pt a.o, nad noted.

## 2020-11-29 NOTE — ED Provider Notes (Signed)
Emergency Medicine Provider Triage Evaluation Note  Austin Montoya , a 25 y.o. male  was evaluated in triage.  Pt complains of seizure, history of same, out of Topomax since 8/7, unable to see PCP for refill. Happened at 11AM while lying in bed, hit forehead on something in his bed, did not fall, no other injuries. Denies drug or alcohol use.  Review of Systems  Positive: seizure Negative: Headache, fever  Physical Exam  BP 98/60 (BP Location: Left Arm)   Pulse (!) 56   Temp 98.2 F (36.8 C) (Oral)   Resp 20   SpO2 100%  Gen:   Awake, no distress   Resp:  Normal effort  MSK:   Moves extremities without difficulty  Other:  Abrasion over right eyebrow (mild), small hematoma to left forehead.  Medical Decision Making  Medically screening exam initiated at 11:56 AM.  Appropriate orders placed.  Austin Montoya was informed that the remainder of the evaluation will be completed by another provider, this initial triage assessment does not replace that evaluation, and the importance of remaining in the ED until their evaluation is complete.     Jeannie Fend, PA-C 11/29/20 1157    Terald Sleeper, MD 11/29/20 [phone]

## 2020-12-04 ENCOUNTER — Other Ambulatory Visit: Payer: Self-pay

## 2020-12-04 ENCOUNTER — Emergency Department (HOSPITAL_COMMUNITY)
Admission: EM | Admit: 2020-12-04 | Discharge: 2020-12-04 | Disposition: A | Discharge status: Home / Self Care | Payer: BC Managed Care – PPO | Attending: Emergency Medicine | Admitting: Emergency Medicine

## 2020-12-04 ENCOUNTER — Emergency Department (HOSPITAL_COMMUNITY): Payer: BC Managed Care – PPO

## 2020-12-04 ENCOUNTER — Encounter (HOSPITAL_COMMUNITY): Payer: Self-pay

## 2020-12-04 DIAGNOSIS — X509XXA Other and unspecified overexertion or strenuous movements or postures, initial encounter: Secondary | ICD-10-CM | POA: Diagnosis not present

## 2020-12-04 DIAGNOSIS — S43004A Unspecified dislocation of right shoulder joint, initial encounter: Secondary | ICD-10-CM | POA: Insufficient documentation

## 2020-12-04 DIAGNOSIS — S42201A Unspecified fracture of upper end of right humerus, initial encounter for closed fracture: Secondary | ICD-10-CM | POA: Insufficient documentation

## 2020-12-04 DIAGNOSIS — F17210 Nicotine dependence, cigarettes, uncomplicated: Secondary | ICD-10-CM | POA: Insufficient documentation

## 2020-12-04 DIAGNOSIS — M21821 Other specified acquired deformities of right upper arm: Secondary | ICD-10-CM

## 2020-12-04 DIAGNOSIS — S4991XA Unspecified injury of right shoulder and upper arm, initial encounter: Secondary | ICD-10-CM | POA: Diagnosis present

## 2020-12-04 DIAGNOSIS — G40309 Generalized idiopathic epilepsy and epileptic syndromes, not intractable, without status epilepticus: Secondary | ICD-10-CM

## 2020-12-04 MED ORDER — TOPIRAMATE 100 MG PO TABS: 100.0000 mg | ORAL_TABLET | Freq: Two times a day (BID) | ORAL | 0 refills | Status: AC

## 2020-12-04 NOTE — ED Notes (Signed)
Patient transported to X-ray 

## 2020-12-04 NOTE — Progress Notes (Signed)
Orthopedic Tech Progress Note Patient Details:  Austin Montoya 1996-01-27 [id number]  Ortho Devices Type of Ortho Device: Sling immobilizer Ortho Device/Splint Location: RUE Ortho Device/Splint Interventions: Ordered, Application, Adjustment   Post Interventions Patient Tolerated: Well Instructions Provided: Care of device  Donald Pore 12/04/2020, 5:49 PM

## 2020-12-04 NOTE — ED Provider Notes (Addendum)
[facility] EMERGENCY DEPARTMENT Provider Note   CSN: [id number] Arrival date & time: 12/04/20  1552     History Chief Complaint  Patient presents with   Shoulder Injury    Austin Montoya is a 25 y.o. male.  Austin Montoya is right-handed.  He has a history of numerous right shoulder dislocations.  Approximately 1 hour prior to arrival, he had his shoulder in an abducted and externally rotated position.  His shoulder popped out of place.  EMS was called, and they administered 200 mcg of fentanyl.  Is any numbness or tingling.  The history is provided by the patient.  Shoulder Injury This is a new problem. The current episode started 1 to 2 hours ago. The problem occurs constantly. The problem has been resolved. Pertinent negatives include no chest pain, no abdominal pain, no headaches and no shortness of breath. Exacerbated by: use of limb. Nothing relieves the symptoms. Treatments tried: fentanyl. The treatment provided significant relief.      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    Past Surgical History:  Procedure Laterality Date   CIRCUMCISION         Family History  Problem Relation Age of Onset   Migraines Mother    ADD / ADHD Father    Depression Father    Anxiety disorder Father    Migraines Brother    ADD / ADHD Brother    Leukemia Maternal Grandfather    Heart Problems Maternal Grandfather    Migraines Maternal Grandfather    Migraines Maternal Aunt    Migraines Cousin        Maternal 1st Cousin    Social History   Tobacco Use   Smoking status: Every Day    Packs/day: 1.00    Types: Cigarettes   Smokeless tobacco: Never  Vaping Use   Vaping Use: Never used  Substance Use Topics   Alcohol use: No     Comment: Drinks alcohol twice a month   Drug use: No    Frequency: 7.0 times per week    Types: Marijuana    Home Medications Prior to Admission medications   Medication Sig Start Date End Date Taking? Authorizing Provider  topiramate (TOPAMAX) 100 MG tablet Take 2 tablets by mouth in the morning and take 2 tablets by mouth at night Patient taking differently: Take 100 mg by mouth 2 (two) times daily. 09/05/15  Yes Elveria Rising, NP    Allergies    Patient has no known allergies.  Review of Systems   Review of Systems  Constitutional:  Negative for chills and fever.  HENT:  Negative for ear pain and sore throat.   Eyes:  Negative for pain and visual disturbance.  Respiratory:  Negative for cough and shortness of breath.   Cardiovascular:  Negative for chest pain and palpitations.  Gastrointestinal:  Negative for abdominal pain and vomiting.  Genitourinary:  Negative for dysuria and hematuria.  Musculoskeletal:  Negative for arthralgias and back pain.  Skin:  Negative for color change and rash.  Neurological:  Negative for seizures, syncope and headaches.  All other systems reviewed and are negative.  Physical Exam Updated Vital Signs BP (!) 111/58 (BP Location: Left Arm)   Pulse (!) 55   Temp  98 F (36.7 C) (Oral)   Resp 15   Ht 5\' 8"  (1.727 m)   Wt 53.1 kg   SpO2 95%   BMI 17.79 kg/m   Physical Exam Vitals and nursing note reviewed.  Constitutional:      Appearance: Normal appearance.  HENT:     Head: Normocephalic and atraumatic.  Eyes:     Conjunctiva/sclera: Conjunctivae normal.  Pulmonary:     Effort: Pulmonary effort is normal. No respiratory distress.  Musculoskeletal:        General: No deformity. Normal range of motion.     Cervical back: Normal range of motion.     Comments: No deformity noted of the right shoulder.  No tenderness to palpation.  Sensation is intact.  Distal pulses are full.  Range of motion is preserved.  Skin:    General: Skin  is warm and dry.  Neurological:     General: No focal deficit present.     Mental Status: He is alert and oriented to person, place, and time. Mental status is at baseline.  Psychiatric:        Mood and Affect: Mood normal.    ED Results / Procedures / Treatments   Labs (all labs ordered are listed, but only abnormal results are displayed) Labs Reviewed - No data to display  EKG None  Radiology DG Shoulder Right  Result Date: 12/04/2020 CLINICAL DATA:  Shoulder subluxation for 1 day, history of prior RIGHT shoulder dislocations EXAM: RIGHT SHOULDER - 2+ VIEW COMPARISON:  10/03/2019 FINDINGS: Osseous mineralization normal. AC joint alignment normal. Visualized ribs intact. 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 No acute fracture, dislocation, or bone destruction. IMPRESSION: No acute osseous abnormalities. 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 Electronically Signed   By: 10/05/2019 M.D.   On: 12/04/2020 16:59    Procedures Procedures   Medications Ordered in ED Medications - No data to display  ED Course  I have reviewed the triage vital signs and the nursing notes.  Pertinent labs & imaging results that were available during my care of the patient were reviewed by me and considered in my medical decision making (see chart for details).    MDM Rules/Calculators/A&P                           Garv Kuechle presents after a right shoulder dislocation.  This spontaneously relocated.  He has had multiple shoulder dislocations, and I counseled him that he likely needs orthopedic surgery follow-up and consideration of labral repair.  He was placed in a sling and discharged home. Final Clinical Impression(s) / ED Diagnoses Final diagnoses:  Dislocation of right shoulder joint, initial encounter  Hill Sachs deformity, right    Rx / DC Orders ED Discharge Orders     None        Marcha Dutton, MD 12/04/20 12/06/20    Si Gaul,  MD 12/04/20 [phone]

## 2020-12-04 NOTE — ED Triage Notes (Signed)
Pt BIB [facility] from home c/o right shoulder dislocation. Pt has had a previous injury to that shoulder reached up to grab something and it popped out. Pt was given 200 mcg of fentanyl for pain.

## 2021-01-07 ENCOUNTER — Emergency Department (HOSPITAL_COMMUNITY): Payer: BC Managed Care – PPO

## 2021-01-07 ENCOUNTER — Other Ambulatory Visit: Payer: Self-pay

## 2021-01-07 ENCOUNTER — Emergency Department (HOSPITAL_COMMUNITY)
Admission: EM | Admit: 2021-01-07 | Discharge: 2021-01-07 | Disposition: A | Discharge status: Home / Self Care | Payer: BC Managed Care – PPO | Attending: Emergency Medicine | Admitting: Emergency Medicine

## 2021-01-07 DIAGNOSIS — S5011XA Contusion of right forearm, initial encounter: Secondary | ICD-10-CM | POA: Insufficient documentation

## 2021-01-07 DIAGNOSIS — S90812A Abrasion, left foot, initial encounter: Secondary | ICD-10-CM | POA: Insufficient documentation

## 2021-01-07 DIAGNOSIS — S90811A Abrasion, right foot, initial encounter: Secondary | ICD-10-CM | POA: Insufficient documentation

## 2021-01-07 DIAGNOSIS — R569 Unspecified convulsions: Secondary | ICD-10-CM | POA: Diagnosis not present

## 2021-01-07 DIAGNOSIS — W19XXXA Unspecified fall, initial encounter: Secondary | ICD-10-CM

## 2021-01-07 DIAGNOSIS — M25511 Pain in right shoulder: Secondary | ICD-10-CM | POA: Diagnosis not present

## 2021-01-07 DIAGNOSIS — X58XXXA Exposure to other specified factors, initial encounter: Secondary | ICD-10-CM | POA: Diagnosis not present

## 2021-01-07 DIAGNOSIS — S0990XA Unspecified injury of head, initial encounter: Secondary | ICD-10-CM | POA: Diagnosis present

## 2021-01-07 DIAGNOSIS — F17210 Nicotine dependence, cigarettes, uncomplicated: Secondary | ICD-10-CM | POA: Insufficient documentation

## 2021-01-07 DIAGNOSIS — S5012XA Contusion of left forearm, initial encounter: Secondary | ICD-10-CM | POA: Insufficient documentation

## 2021-01-07 DIAGNOSIS — S0083XA Contusion of other part of head, initial encounter: Secondary | ICD-10-CM | POA: Insufficient documentation

## 2021-01-07 DIAGNOSIS — S90819A Abrasion, unspecified foot, initial encounter: Secondary | ICD-10-CM

## 2021-01-07 LAB — BASIC METABOLIC PANEL
Anion gap: 12 (ref 5–15)
BUN: 8 mg/dL (ref 6–20)
CO2: 20 mmol/L — ABNORMAL LOW (ref 22–32)
Calcium: 9.4 mg/dL (ref 8.9–10.3)
Chloride: 107 mmol/L (ref 98–111)
Creatinine, Ser: 0.98 mg/dL (ref 0.61–1.24)
GFR, Estimated: >60 mL/min (ref >60)
Glucose, Bld: 119 mg/dL — ABNORMAL HIGH (ref 70–99)
Potassium: 3.5 mmol/L (ref 3.5–5.1)
Sodium: 139 mmol/L (ref 135–145)

## 2021-01-07 LAB — CBC WITH DIFFERENTIAL/PLATELET
Abs Immature Granulocytes: 0.08 10*3/uL — ABNORMAL HIGH (ref 0.00–0.07)
Basophils Absolute: 0 10*3/uL (ref 0.0–0.1)
Basophils Relative: 0 %
Eosinophils Absolute: 0 10*3/uL (ref 0.0–0.5)
Eosinophils Relative: 0 %
HCT: 44.6 % (ref 39.0–52.0)
Hemoglobin: 15.7 g/dL (ref 13.0–17.0)
Immature Granulocytes: 0 %
Lymphocytes Relative: 9 %
Lymphs Abs: 1.7 10*3/uL (ref 0.7–4.0)
MCH: 30.2 pg (ref 26.0–34.0)
MCHC: 35.2 g/dL (ref 30.0–36.0)
MCV: 85.8 fL (ref 80.0–100.0)
Monocytes Absolute: 0.9 10*3/uL (ref 0.1–1.0)
Monocytes Relative: 5 %
Neutro Abs: 16.2 10*3/uL — ABNORMAL HIGH (ref 1.7–7.7)
Neutrophils Relative %: 86 %
Platelets: 246 10*3/uL (ref 150–400)
RBC: 5.2 MIL/uL (ref 4.22–5.81)
RDW: 12.6 % (ref 11.5–15.5)
WBC: 18.9 10*3/uL — ABNORMAL HIGH (ref 4.0–10.5)
nRBC: 0 % (ref 0.0–0.2)

## 2021-01-07 MED ORDER — TOPIRAMATE 25 MG PO TABS
50.0000 mg | ORAL_TABLET | Freq: Every day | ORAL | Status: DC
  Administered 2021-01-07: 50 mg via ORAL
  Filled 2021-01-07: qty 2

## 2021-01-07 MED ORDER — SODIUM CHLORIDE 0.9 % IV BOLUS: 1000.0000 mL | Freq: Once | INTRAVENOUS | Status: DC

## 2021-01-07 MED ORDER — BACITRACIN ZINC 500 UNIT/GM EX OINT
TOPICAL_OINTMENT | Freq: Two times a day (BID) | CUTANEOUS | Status: DC
  Administered 2021-01-07: 3 via TOPICAL

## 2021-01-07 MED ORDER — ACETAMINOPHEN 500 MG PO TABS
1000.0000 mg | ORAL_TABLET | Freq: Once | ORAL | Status: AC
  Administered 2021-01-07: 1000 mg via ORAL
  Filled 2021-01-07: qty 2

## 2021-01-07 NOTE — ED Triage Notes (Signed)
Pt brought in by EMS for seizures. Pt reports he has had 3 seizures today. Pt has a history of same.

## 2021-01-07 NOTE — ED Provider Notes (Signed)
[facility] EMERGENCY DEPARTMENT Provider Note   CSN: [id number] Arrival date & time: 01/07/21  1946     History Chief Complaint  Patient presents with   Seizures    Jaramiah Sciarra is a 25 y.o. male.  HPI Patient is 25 year old male on Topamax admits to occasionally missing taking his medication but is not particularly forthcoming about how often he misses doses.  States that he did take Topamax this morning and this evening he is prescribed it twice daily.  He states that he had a seizure this morning as well as this afternoon.  He states that around 11 AM this morning as when he had a seizure he states that it happened at his house with his mother he states his second seizure took place this afternoon outside.  He does not have much information about what happened.  He is not having any significant pain apart from right shoulder pain and bilateral toe pain affecting almost all of his toes with some abrasions there.  He states that he also bit his tongue and has had some bleeding from his tongue.  He denies any chest pain shortness of breath lightheadedness or dizziness.  States he feels relatively well at this time.  Does not feel confused but feels fatigued and tired.  Denies any recreational drug use or alcohol use No other associate symptoms.  No aggravating mitigating factors.  Discussed with mother Tameshia  11:15 am this morning. Thinks he had a seizure. Talked to mom and he seemed somewhat post-ictal.   Went to sleep and woke up around 1pm. 1:30pm mother in kitchen pt fell and seizured for less than 1 minute. Got up after.   Laid back down after. Then baby mother came over and ~6:45 Larey Seat on the ground there and had 3rd seizure.   Has seizures maybe every 6 months.      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    Past Surgical History:  Procedure Laterality Date   CIRCUMCISION         Family History  Problem Relation Age of Onset   Migraines Mother    ADD / ADHD Father    Depression Father    Anxiety disorder Father    Migraines Brother    ADD / ADHD Brother    Leukemia Maternal Grandfather    Heart Problems Maternal Grandfather    Migraines Maternal Grandfather    Migraines Maternal Aunt    Migraines Cousin        Maternal 1st Cousin    Social History   Tobacco Use   Smoking status: Every Day    Packs/day: 1.00    Types: Cigarettes   Smokeless tobacco: Never  Vaping Use   Vaping Use: Never used  Substance Use Topics   Alcohol use: No    Comment: Drinks alcohol twice a month   Drug use: No    Frequency: 7.0 times per week    Types: Marijuana    Home Medications Prior to Admission medications   Medication Sig Start Date End Date Taking? Authorizing Provider  topiramate (TOPAMAX) 100 MG tablet Take 1 tablet (100 mg total) by mouth 2 (two) times daily. 12/04/20 01/03/21  Koleen Distance, MD    Allergies  Patient has no known allergies.  Review of Systems   Review of Systems  Constitutional:  Negative for chills and fever.  HENT:  Negative for congestion.        Tongue laceration  Eyes:  Negative for pain.  Respiratory:  Negative for cough and shortness of breath.   Cardiovascular:  Negative for chest pain and leg swelling.  Gastrointestinal:  Negative for abdominal pain and vomiting.  Genitourinary:  Negative for dysuria.  Musculoskeletal:  Positive for arthralgias. Negative for myalgias.       Right shoulder pain  Skin:  Positive for wound. Negative for rash.  Neurological:  Positive for headaches. Negative for dizziness.       Head trauma/headache   Physical Exam Updated Vital Signs BP 117/88   Pulse 68   Temp 99.2 F (37.3 C) (Oral)   Resp (!) 22   Ht 5\' 8"   (1.727 m)   Wt 53 kg   SpO2 98%   BMI 17.77 kg/m   Physical Exam Vitals and nursing note reviewed.  Constitutional:      General: He is not in acute distress.    Comments: Pleasant well-appearing 25 year old. Sitting in bed.  Able answer questions appropriately follow commands. No increased work of breathing. Speaking in full sentences.   HENT:     Head: Normocephalic and atraumatic.     Nose: Nose normal.  Eyes:     General: No scleral icterus. Cardiovascular:     Rate and Rhythm: Normal rate and regular rhythm.     Pulses: Normal pulses.     Heart sounds: Normal heart sounds.  Pulmonary:     Effort: Pulmonary effort is normal. No respiratory distress.     Breath sounds: Normal breath sounds. No wheezing.  Abdominal:     Palpations: Abdomen is soft.     Tenderness: There is no abdominal tenderness. There is no guarding or rebound.  Musculoskeletal:     Cervical back: Normal range of motion.     Right lower leg: No edema.     Left lower leg: No edema.     Comments: Tenderness to palpation of right shoulder no other upper or lower extremity tenderness apart from some tenderness over the abrasions of his toes.  Skin:    General: Skin is warm and dry.     Capillary Refill: Capillary refill takes less than 2 seconds.     Comments: Abrasions to toes of bilateral feet superficial able to wiggle toes.   Neurological:     Mental Status: He is alert. Mental status is at baseline.     Comments: Alert and oriented to self, place, time and event.   Speech is fluent, clear without dysarthria or dysphasia.   Strength 5/5 in upper/lower extremities   Sensation intact in upper/lower extremities   CN I not tested  CN II grossly intact visual fields bilaterally. Did not visualize posterior eye.  CN III, IV, VI PERRLA and EOMs intact bilaterally  CN V Intact sensation to sharp and light touch to the face  CN VII facial movements symmetric  CN VIII not tested  CN IX, X no uvula  deviation, symmetric rise of soft palate  CN XI 5/5 SCM and trapezius strength bilaterally  CN XII Midline tongue protrusion, symmetric L/R movements    Psychiatric:        Mood and Affect: Mood normal.        Behavior: Behavior normal.    ED Results / Procedures / Treatments  Labs (all labs ordered are listed, but only abnormal results are displayed) Labs Reviewed  BASIC METABOLIC PANEL - Abnormal; Notable for the following components:      Result Value   CO2 20 (*)    Glucose, Bld 119 (*)    All other components within normal limits  CBC WITH DIFFERENTIAL/PLATELET - Abnormal; Notable for the following components:   WBC 18.9 (*)    Neutro Abs 16.2 (*)    Abs Immature Granulocytes 0.08 (*)    All other components within normal limits  TOPIRAMATE LEVEL    EKG None  Radiology DG Shoulder Right  Result Date: 01/07/2021 CLINICAL DATA:  Abrasion EXAM: RIGHT SHOULDER - 2+ VIEW COMPARISON:  12/05/2022 FINDINGS: There is no evidence of fracture or dislocation. There is no evidence of arthropathy or other focal bone abnormality. Soft tissues are unremarkable. 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 IMPRESSION: No acute osseous abnormality. Electronically Signed   By: Jasmine Pang M.D.   On: 01/07/2021 21:03   DG Foot 2 Views Right  Result Date: 01/07/2021 CLINICAL DATA:  Abrasion EXAM: RIGHT FOOT - 2 VIEW COMPARISON:  None. FINDINGS: There is no evidence of fracture or dislocation. There is no evidence of arthropathy or other focal bone abnormality. Soft tissues are unremarkable. IMPRESSION: Negative. Electronically Signed   By: Jasmine Pang M.D.   On: 01/07/2021 21:01   DG Foot Complete Left  Result Date: 01/07/2021 CLINICAL DATA:  Abrasions EXAM: LEFT FOOT - COMPLETE 3+ VIEW COMPARISON:  None. FINDINGS: There is no evidence of fracture or dislocation. There is no evidence of arthropathy or other focal bone abnormality. Soft tissues are unremarkable. IMPRESSION: Negative.  Electronically Signed   By: Jasmine Pang M.D.   On: 01/07/2021 21:02    Procedures Procedures   Medications Ordered in ED Medications  topiramate (TOPAMAX) tablet 50 mg (50 mg Oral Given 01/07/21 2108)  bacitracin ointment (has no administration in time range)  acetaminophen (TYLENOL) tablet 1,000 mg (1,000 mg Oral Given 01/07/21 2011)    ED Course  I have reviewed the triage vital signs and the nursing notes.  Pertinent labs & imaging results that were available during my care of the patient were reviewed by me and considered in my medical decision making (see chart for details).  Clinical Course as of 01/08/21 0004  Wynelle Link Jan 07, 2021  2142 Discussed with Dr. Kyra Leyland who recommends topomax 50mg  in addition to his doses today.  Will recommend pt treat the predisposing factors of dehydration and sleep deprivation and FU with neurologist .  [WF]    Clinical Course User Index [WF] Brooke Dare, Gailen Shelter    MDM Rules/Calculators/A&P                           Patient is a 25 year old male presented to ER today with seizure x3 that occurred today  Times documented in HPI per mother.  On physical exam patient is well-appearing neurologically intact seems to be mentating at baseline somewhat slow and tired but does not appear to be altered he is alert and oriented x3 and able to follow commands answer questions properly.  He does have some abrasions to the left side of his head and a contusion to his forearms forehead.  He also has some right shoulder tenderness palpation and bilateral feet abrasions with no lacerations or bony tenderness.  Will obtain x-rays to rule out underlying fracture CT head given trauma  Seems that his trigger was decreased sleep and dehydration  Received p.o. fluid and food here in ER.  Does have tongue laceration on exam as well  CBC with leukocytosis likely due to seizure.  BMP unremarkable.  Blood sugar within normal limits.  Topiramate level pending will  be followed by neurology.  Personally viewed x-ray of right shoulder and bilateral feet there are no fractures.  No dislocations.  EKG shows sinus arrhythmia but no other acute abnormality.  No evidence of ischemia  CT head unremarkable  After discussion with Dr. Thomasena Edis of neurology will offer patient discharged home with 150 twice daily Topamax versus treating his dehydration and sleep deprivation.  Wounds dressed on feet.  He states that he will expediently follow-up with neurology.  Discussed with patient and mother at bedside.  They are agreeable to plan.  We will follow-up with neurology.  Final Clinical Impression(s) / ED Diagnoses Final diagnoses:  Seizure [facility])  Traumatic injury of head, initial encounter  Fall, initial encounter  Abrasion of foot, unspecified laterality, initial encounter    Rx / DC Orders ED Discharge Orders     None        Gailen Shelter, [place] 01/08/21 0005    Benjiman Core, MD 01/08/21 [phone]

## 2021-01-07 NOTE — ED Notes (Signed)
Pt discharged and ambulated out of the ED without difficulty. 

## 2021-01-07 NOTE — Discharge Instructions (Signed)
Please continue to hydrate, eat 3 square meals plus snacks, make sure that you get plenty of sleep.  Avoid alcohol or any recreational drugs.  Please take your medications exactly as prescribed. You may always return to the ER for any new or concerning symptoms.  Otherwise please follow-up with your neurologist.

## 2023-02-06 IMAGING — CR DG FOOT COMPLETE 3+V*L*
3 series · 3 of 3 positions shown · non-contrast
Comparison: None.

CLINICAL DATA: Abrasions

EXAM:
LEFT FOOT - COMPLETE 3+ VIEW

[foot ap]
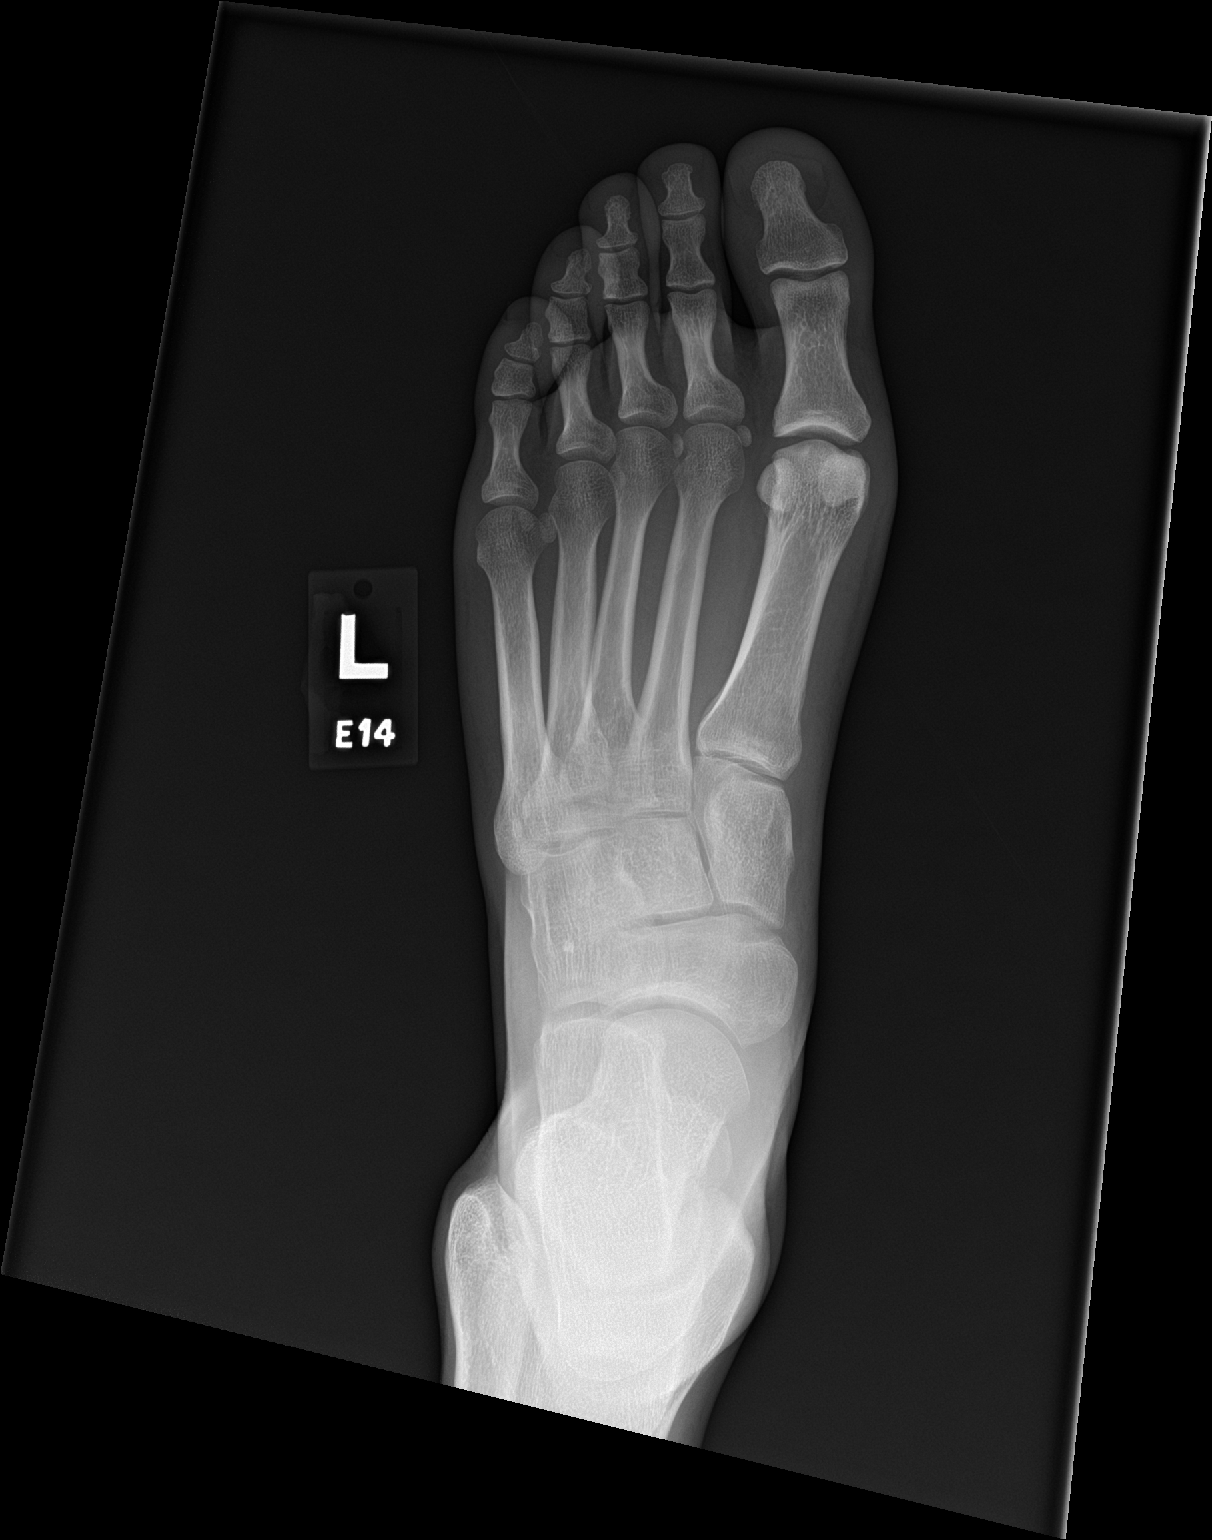

[foot obl]
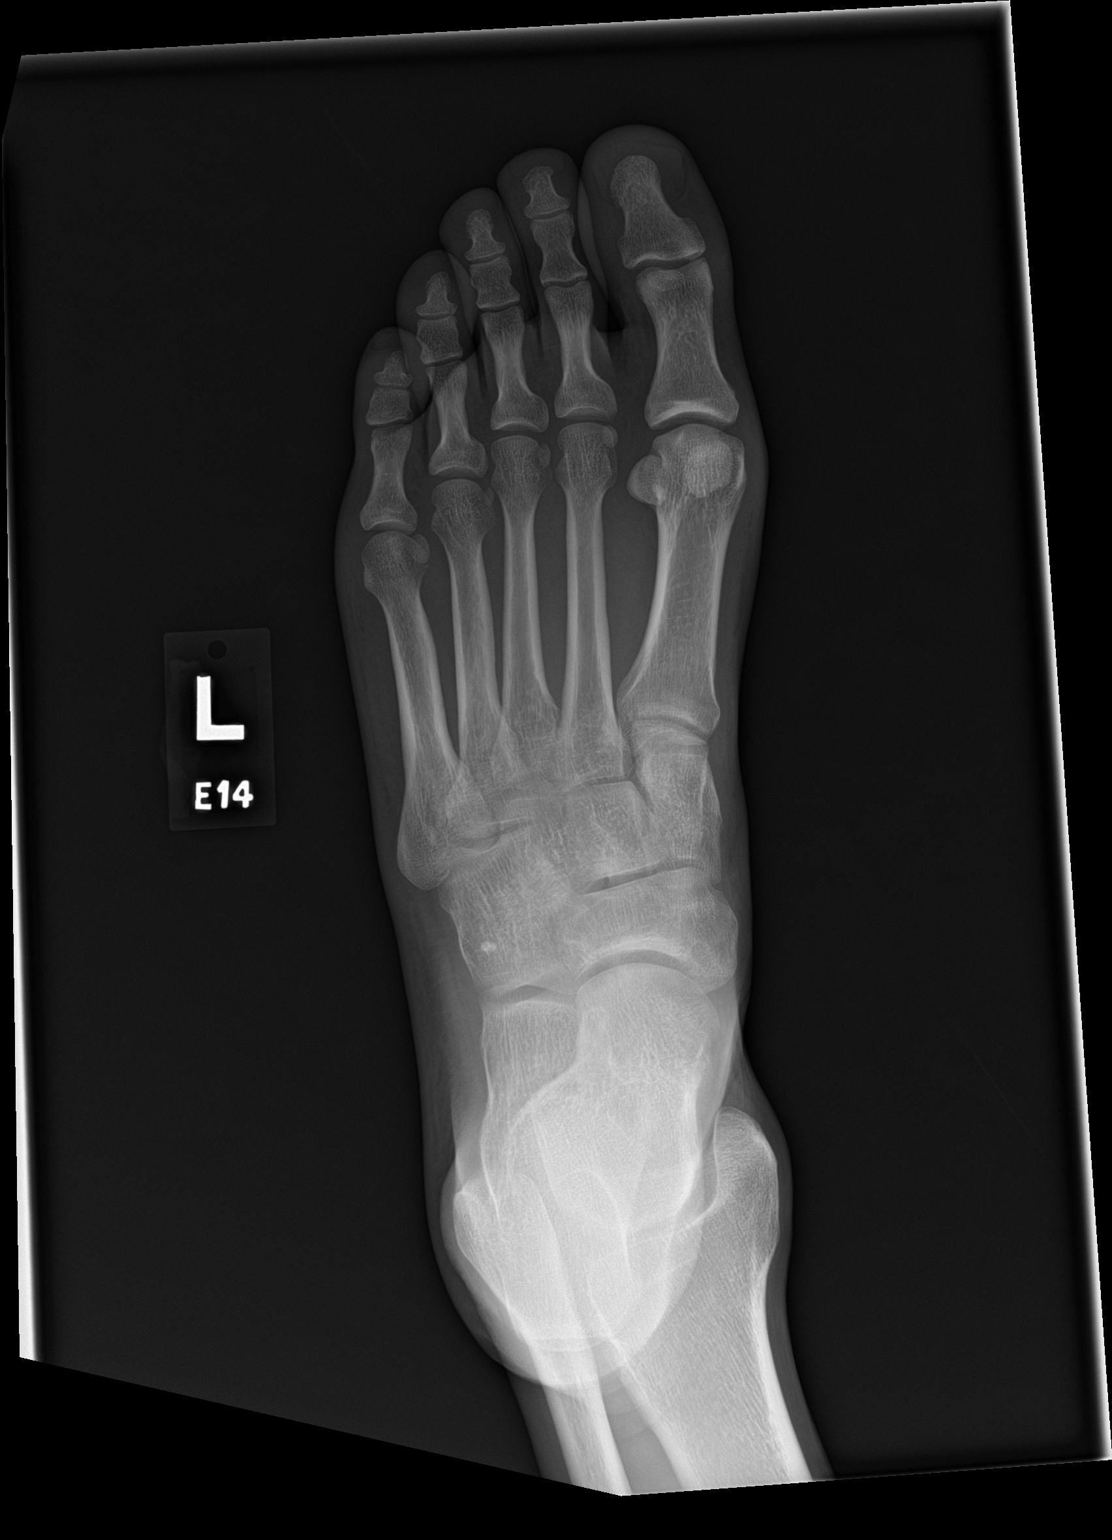

[foot lat]
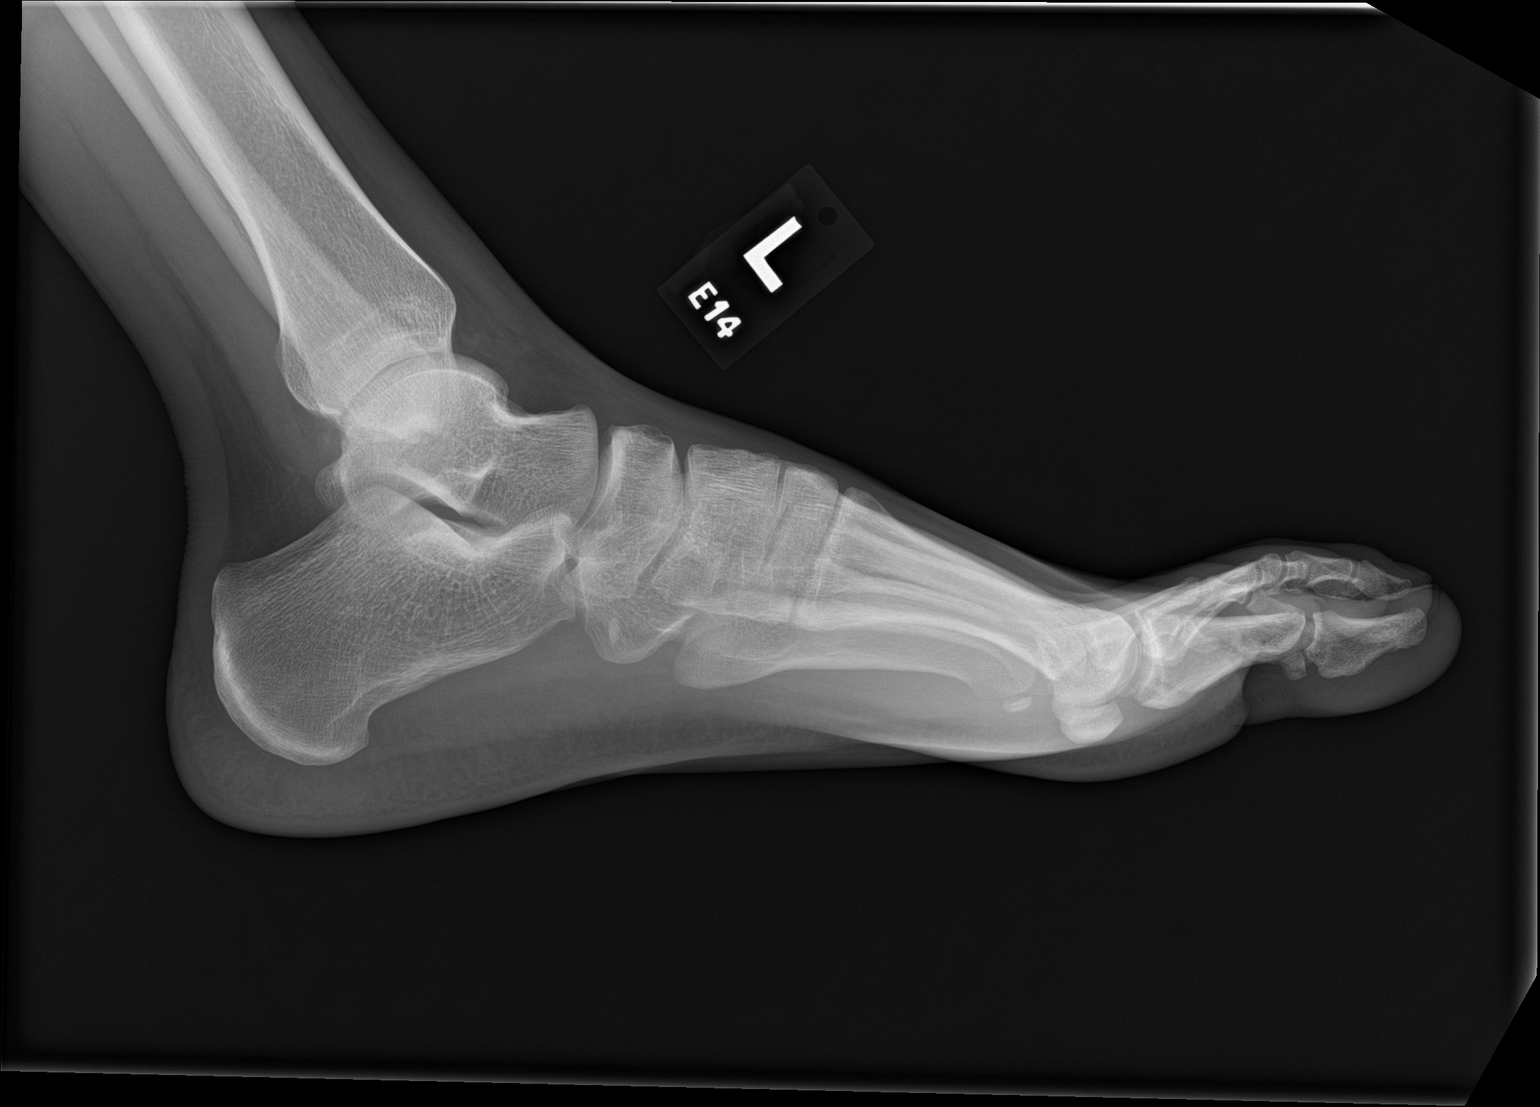

[3 of 3 positions shown; findings below may reference images not displayed]

FINDINGS: There is no evidence of fracture or dislocation. There is no
evidence of arthropathy or other focal bone abnormality. Soft
tissues are unremarkable.
IMPRESSION: Negative.

## 2023-02-06 IMAGING — CR DG SHOULDER 2+V*R*
4 series · 4 of 4 positions shown · non-contrast
Comparison: 12/05/2022

CLINICAL DATA: Abrasion

EXAM:
RIGHT SHOULDER - 2+ VIEW

[shoulder y view]
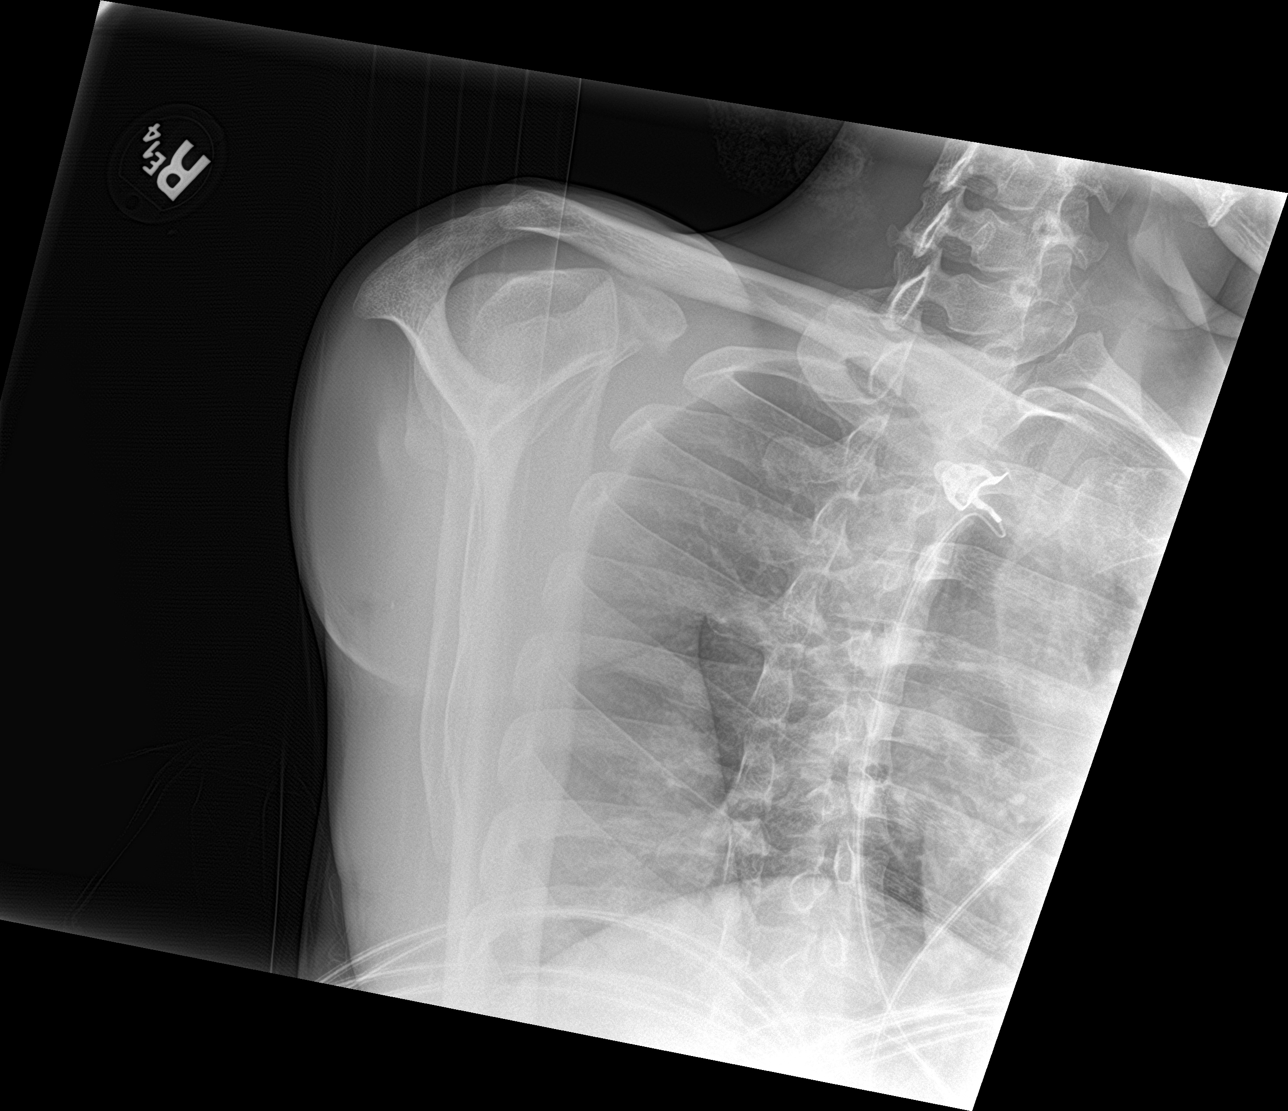

[shoulder axillary]
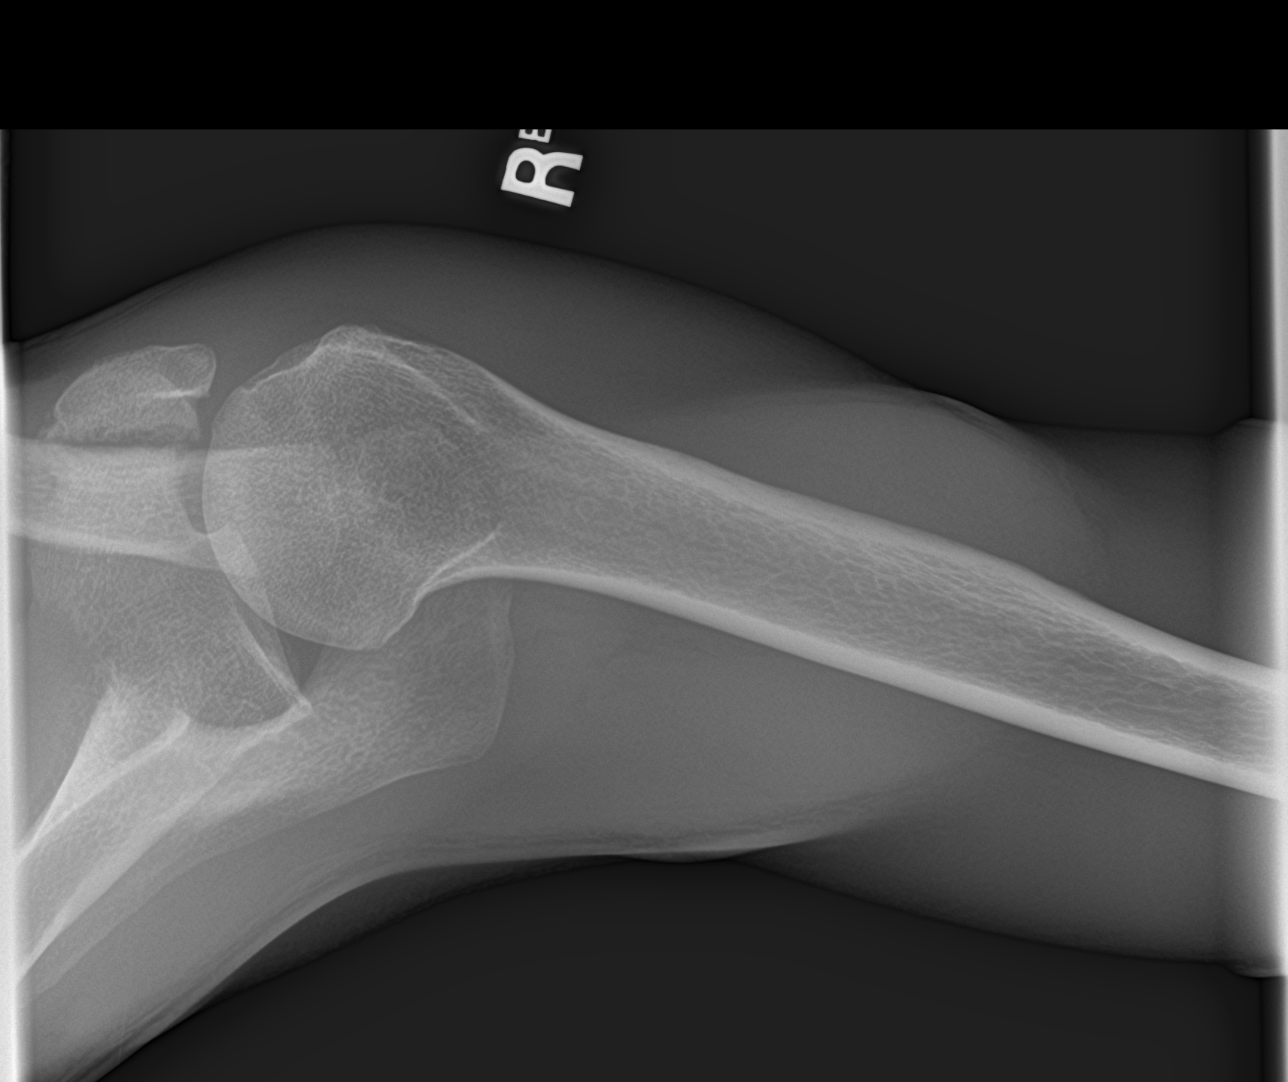

[shoulder ap neutral]
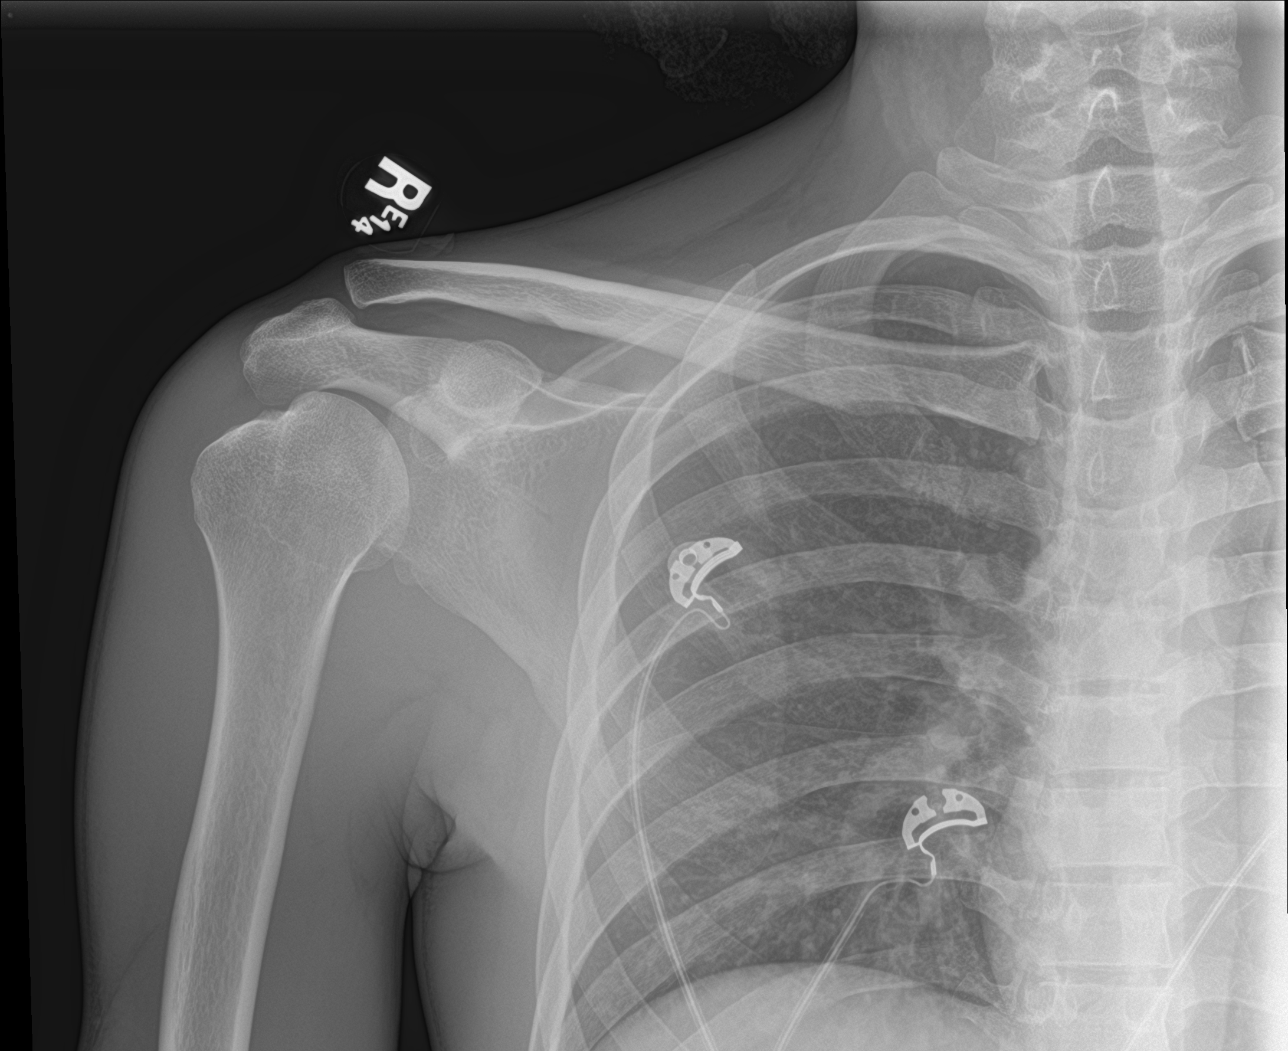

[shoulder grashey]
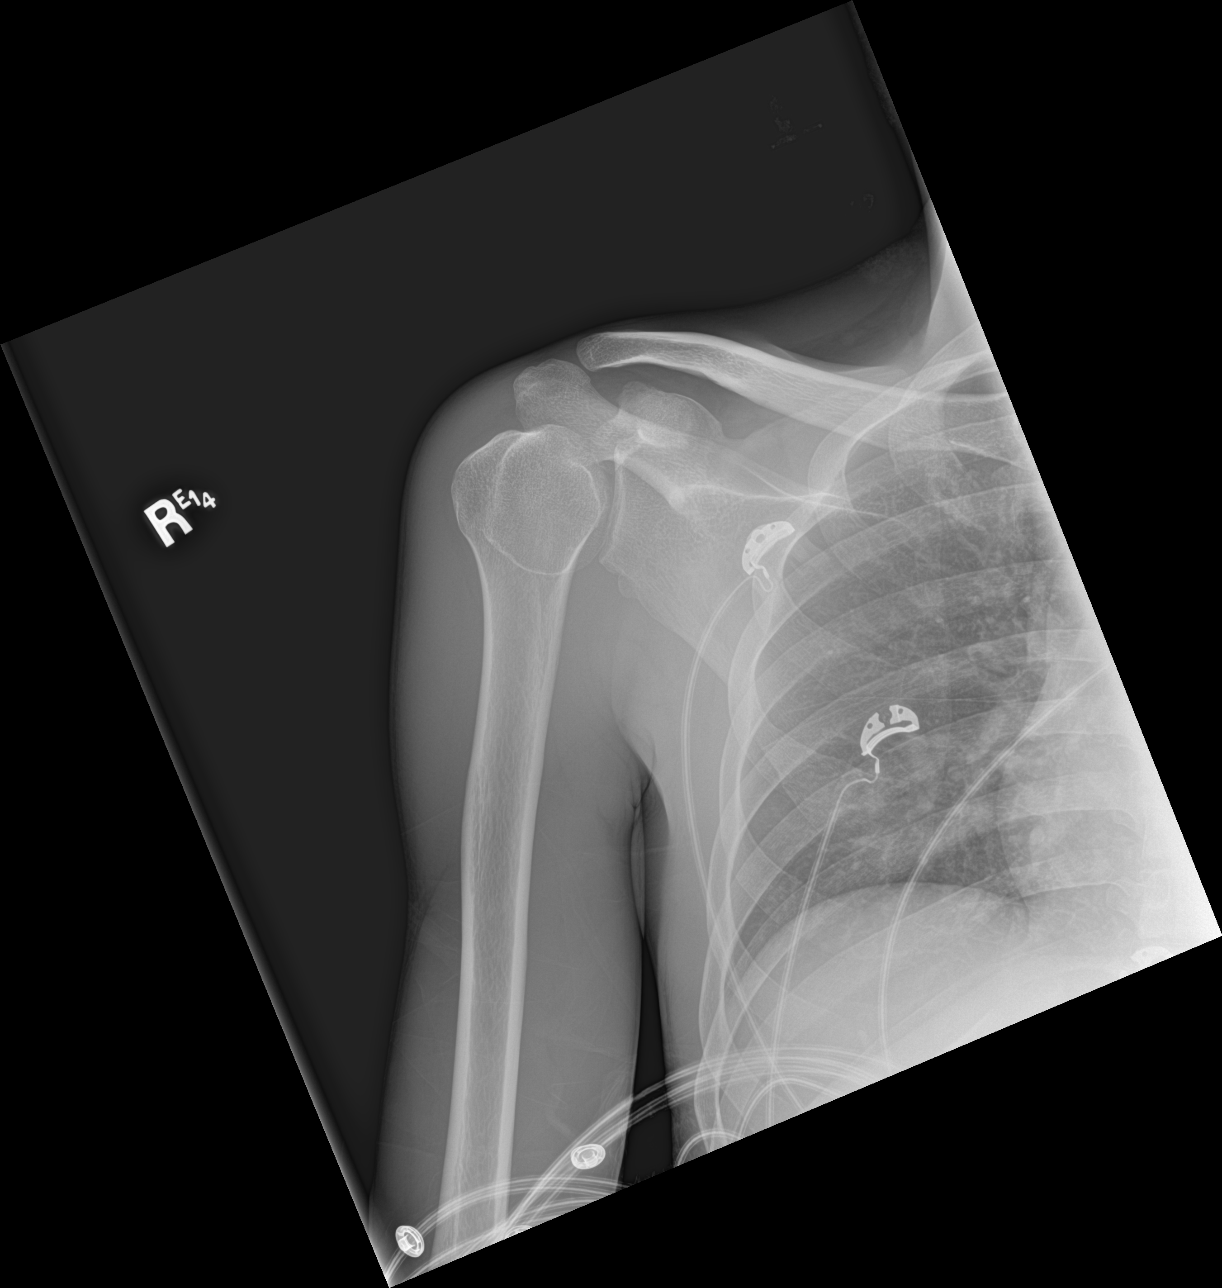

[4 of 4 positions shown; findings below may reference images not displayed]

FINDINGS: There is no evidence of fracture or dislocation. There is no
evidence of arthropathy or other focal bone abnormality. Soft
tissues are unremarkable. Chronic Hill-Sachs deformity at the
humeral head.
IMPRESSION: No acute osseous abnormality.

## 2023-11-24 ENCOUNTER — Emergency Department (HOSPITAL_COMMUNITY)

## 2023-11-24 ENCOUNTER — Emergency Department (HOSPITAL_COMMUNITY)
Admission: EM | Admit: 2023-11-24 | Discharge: 2023-11-25 | Disposition: A | Discharge status: Home / Self Care | Attending: Emergency Medicine | Admitting: Emergency Medicine

## 2023-11-24 ENCOUNTER — Other Ambulatory Visit: Payer: Self-pay

## 2023-11-24 DIAGNOSIS — R569 Unspecified convulsions: Secondary | ICD-10-CM | POA: Diagnosis present

## 2023-11-24 DIAGNOSIS — M25511 Pain in right shoulder: Secondary | ICD-10-CM | POA: Diagnosis not present

## 2023-11-24 DIAGNOSIS — G40909 Epilepsy, unspecified, not intractable, without status epilepticus: Secondary | ICD-10-CM | POA: Diagnosis not present

## 2023-11-24 DIAGNOSIS — W19XXXA Unspecified fall, initial encounter: Secondary | ICD-10-CM | POA: Insufficient documentation

## 2023-11-24 LAB — CBG MONITORING, ED: Glucose-Capillary: 90 mg/dL (ref 70–99)

## 2023-11-24 NOTE — ED Notes (Signed)
No answer  when called in lobby 

## 2023-11-24 NOTE — ED Triage Notes (Addendum)
 Pt had seizure today, unwitnessed. Pt takes seizure meds and has not missed any. Pt not post ictal in triage. Pt bit tongue and is having right shoulder pain. Pt fell from standing when having seizure. Last seizure was feb 2025

## 2023-11-24 NOTE — ED Notes (Signed)
 Patient transported to CT

## 2023-11-24 NOTE — ED Provider Notes (Signed)
 [place] EMERGENCY DEPARTMENT AT [facility] Provider Note   CSN: [id number] Arrival date & time: 11/24/23  2145     Patient presents with: Seizures   Austin Montoya is a 28 y.o. male.   Patient with history of seizure disorder here with suspected seizure.  He remembers getting out of the shower and next thing he knows he was on the ground.  Mother reports he had a seizure and did bite his tongue.  No incontinence.  Takes Topamax  and denies any missed doses.  Last seizure was in February.  Denies fever, chills, nausea or vomiting.  No chest pain or shortness of breath.  Complains of pain to his right shoulder.  No focal weakness, numbness or tingling.  Denies any recent illness.  Denies alcohol or drug use.  States he did take the Topamax   tonight.  No recent medication changes.  The history is provided by the patient.  Seizures      Prior to Admission medications   Medication Sig Start Date End Date Taking? Authorizing Provider  topiramate  (TOPAMAX ) 100 MG tablet Take 1 tablet (100 mg total) by mouth 2 (two) times daily. 12/04/20 01/03/21  Brien Therisa MATSU, MD    Allergies: Patient has no known allergies.    Review of Systems  Constitutional:  Negative for activity change, appetite change and fever.  HENT:  Negative for congestion and rhinorrhea.   Respiratory:  Negative for cough, chest tightness and shortness of breath.   Cardiovascular:  Negative for chest pain.  Gastrointestinal:  Negative for abdominal pain, nausea and vomiting.  Genitourinary:  Negative for dysuria and hematuria.  Musculoskeletal:  Negative for arthralgias and myalgias.  Neurological:  Positive for seizures.   all other systems are negative except as noted in the HPI and PMH.    Updated Vital Signs BP (!) 128/90 (BP Location: Right Arm)   Pulse 94   Temp 99.6 F (37.6 C)   Resp 16   Ht 5' 7 (1.702 m)   Wt 54.4 kg   SpO2 99%   BMI 18.79 kg/m   Physical Exam Vitals and nursing note  reviewed.  Constitutional:      General: He is not in acute distress.    Appearance: He is well-developed.  HENT:     Head: Normocephalic and atraumatic.     Mouth/Throat:     Pharynx: No oropharyngeal exudate.     Comments: +tongue biting Eyes:     Conjunctiva/sclera: Conjunctivae normal.     Pupils: Pupils are equal, round, and reactive to light.  Neck:     Comments: No midline C spine pain. Cardiovascular:     Rate and Rhythm: Normal rate and regular rhythm.     Heart sounds: Normal heart sounds. No murmur heard. Pulmonary:     Effort: Pulmonary effort is normal. No respiratory distress.     Breath sounds: Normal breath sounds.  Chest:     Chest wall: No tenderness.  Abdominal:     Palpations: Abdomen is soft.     Tenderness: There is no abdominal tenderness. There is no guarding or rebound.  Musculoskeletal:        General: Tenderness present. Normal range of motion.     Cervical back: Normal range of motion and neck supple.     Comments: Pain with ROM R shoulder. No deformity.  Skin:    General: Skin is warm.  Neurological:     Mental Status: He is alert and oriented to person,  place, and time.     Cranial Nerves: No cranial nerve deficit.     Motor: No abnormal muscle tone.     Coordination: Coordination normal.     Comments:  5/5 strength throughout. CN 2-12 intact.Equal grip strength.   Psychiatric:        Behavior: Behavior normal.     (all labs ordered are listed, but only abnormal results are displayed) Labs Reviewed  CBC WITH DIFFERENTIAL/PLATELET - Abnormal; Notable for the following components:      Result Value   WBC 12.5 (*)    Neutro Abs 10.2 (*)    All other components within normal limits  COMPREHENSIVE METABOLIC PANEL WITH GFR  URINALYSIS, ROUTINE W REFLEX MICROSCOPIC  CBG MONITORING, ED  TROPONIN I (HIGH SENSITIVITY)    EKG: EKG Interpretation Date/Time:  Tuesday November 25 2023 01:35:39 EDT Ventricular Rate:  49 PR Interval:  175 QRS  Duration:  88 QT Interval:  395 QTC Calculation: 357 R Axis:   72  Text Interpretation: Sinus bradycardia J point elevation similar to previous Confirmed by Carita Senior [phone]) on 11/25/2023 1:42:25 AM  Radiology: CT Head Wo Contrast Result Date: 11/25/2023 CLINICAL DATA:  Seizure. EXAM: CT HEAD WITHOUT CONTRAST TECHNIQUE: Contiguous axial images were obtained from the base of the skull through the vertex without intravenous contrast. RADIATION DOSE REDUCTION: This exam was performed according to the departmental dose-optimization program which includes automated exposure control, adjustment of the mA and/or kV according to patient size and/or use of iterative reconstruction technique. COMPARISON:  January 07, 2021 FINDINGS: Brain: No evidence of acute infarction, hemorrhage, hydrocephalus, extra-axial collection or mass lesion/mass effect. Vascular: No hyperdense vessel or unexpected calcification. Skull: Normal. Negative for fracture or focal lesion. Sinuses/Orbits: No acute finding. Other: None. IMPRESSION: No acute intracranial pathology. Electronically Signed   By: Suzen Dials M.D.   On: 11/25/2023 01:00   DG Shoulder Right Result Date: 11/24/2023 CLINICAL DATA:  Chest and shoulder pain.  Seizure earlier tonight. EXAM: RIGHT SHOULDER - 2+ VIEW COMPARISON:  Radiograph 01/07/2021 FINDINGS: No acute fracture or dislocation. Chronic deformity of the lateral humeral head suggestive of remote prior dislocation/injury. Chronic irregularity of the inferior glenoid. Suggestion of glenohumeral joint space narrowing. The acromioclavicular joint is normal. IMPRESSION: 1. No acute fracture or dislocation. 2. 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 Electronically Signed   By: Andrea Gasman M.D.   On: 11/24/2023 22:58     Procedures   Medications Ordered in the ED - No data to display                                  Medical Decision Making Amount and/or  Complexity of Data Reviewed Labs: ordered. Decision-making details documented in ED Course. Radiology: ordered and independent interpretation performed. Decision-making details documented in ED Course. ECG/medicine tests: ordered and independent interpretation performed. Decision-making details documented in ED Course.  Risk Prescription drug management.   Suspected seizure with known seizure disorder.  Compliance with Topamax .  Did bite his tongue.  Complains of pain to right shoulder.  Unsure if he hit his head.  No fever or recent illness.  Compliant with medications.  Patient appears back to baseline.  No fever.  Mild leukocytosis of 12.  Complains of pain to right shoulder only.  Shoulder x-ray is negative for fracture or dislocation.  D/w Dr. Federico of neurology.  Recommends increasing nighttime dose of Topamax   to 150 mg.  Continue 100 mg in a.m.  Electrolyte reassuring.  Leukocytosis but no fever.  No meningismus.  Right shoulder x-ray is negative.  CT head is stable.  EKG shows sinus rhythm.  There is some ST elevation inferiorly and laterally consistent with early repolarization similar to 2022.  He denies chest pain.  Discussed with Dr. Orlando of cardiology who agrees this is likely benign early repolarization.  Troponin is negative.  Discussed with patient.  Will increase his Topamax  to 150 mg at night and 100 mg in the morning.  He states he does have enough of this medication at home.  He knows not to drive or operate heavy machinery cleared by his neurologist.  Patient is back to baseline.  Tolerating p.o. and ambulatory.  Denies complaints.  He understands not to drive or operate heavy machinery until cleared by his neurologist.  Increase Topamax  to 150 mg at night and 100 mg during the day. Return precautions discussed.  Follow-up with his neurologist     Final diagnoses:  Seizure [facility]    ED Discharge Orders     None          Carita Senior, MD 11/25/23  [phone]

## 2023-11-25 LAB — CBC WITH DIFFERENTIAL/PLATELET
Abs Immature Granulocytes: 0.02 K/uL (ref 0.00–0.07)
Basophils Absolute: 0 K/uL (ref 0.0–0.1)
Basophils Relative: 0 %
Eosinophils Absolute: 0 K/uL (ref 0.0–0.5)
Eosinophils Relative: 0 %
HCT: 45.6 % (ref 39.0–52.0)
Hemoglobin: 15.4 g/dL (ref 13.0–17.0)
Immature Granulocytes: 0 %
Lymphocytes Relative: 13 %
Lymphs Abs: 1.6 K/uL (ref 0.7–4.0)
MCH: 28.9 pg (ref 26.0–34.0)
MCHC: 33.8 g/dL (ref 30.0–36.0)
MCV: 85.7 fL (ref 80.0–100.0)
Monocytes Absolute: 0.7 K/uL (ref 0.1–1.0)
Monocytes Relative: 5 %
Neutro Abs: 10.2 K/uL — ABNORMAL HIGH (ref 1.7–7.7)
Neutrophils Relative %: 82 %
Platelets: 235 K/uL (ref 150–400)
RBC: 5.32 MIL/uL (ref 4.22–5.81)
RDW: 12.5 % (ref 11.5–15.5)
WBC: 12.5 K/uL — ABNORMAL HIGH (ref 4.0–10.5)
nRBC: 0 % (ref 0.0–0.2)

## 2023-11-25 LAB — COMPREHENSIVE METABOLIC PANEL WITH GFR
ALT: 14 U/L (ref 0–44)
AST: 19 U/L (ref 15–41)
Albumin: 4 g/dL (ref 3.5–5.0)
Alkaline Phosphatase: 78 U/L (ref 38–126)
Anion gap: 8 (ref 5–15)
BUN: 10 mg/dL (ref 6–20)
CO2: 23 mmol/L (ref 22–32)
Calcium: 9.1 mg/dL (ref 8.9–10.3)
Chloride: 109 mmol/L (ref 98–111)
Creatinine, Ser: 0.88 mg/dL (ref 0.61–1.24)
GFR, Estimated: >60 mL/min (ref >60)
Glucose, Bld: 85 mg/dL (ref 70–99)
Potassium: 4 mmol/L (ref 3.5–5.1)
Sodium: 140 mmol/L (ref 135–145)
Total Bilirubin: 0.7 mg/dL (ref 0.0–1.2)
Total Protein: 7.1 g/dL (ref 6.5–8.1)

## 2023-11-25 LAB — TROPONIN I (HIGH SENSITIVITY): Troponin I (High Sensitivity): 4 ng/L (ref <18)

## 2023-11-25 MED ORDER — KETOROLAC TROMETHAMINE 15 MG/ML IJ SOLN
15.0000 mg | Freq: Once | INTRAMUSCULAR | Status: AC
  Administered 2023-11-25 (×2): 15 mg via INTRAVENOUS
  Filled 2023-11-25: qty 1

## 2023-11-25 NOTE — Discharge Instructions (Signed)
 Increase your Topamax  to 150 mg at night.  Continue Topamax  100 mg in the morning.  Do not drive or operate heavy machinery until cleared by your neurologist.  Return to the ED with new or worsening symptoms.

## 2023-11-25 NOTE — ED Notes (Signed)
 Patient ambulated in hallway, steady gait, no complaints.

## 2023-11-25 NOTE — ED Notes (Signed)
 ED Provider at bedside.
